# Patient Record
Sex: Female | Born: 1970 | Race: White | Hispanic: No | Smoking: Former smoker
Health system: Southern US, Community
[De-identification: ages and names within clinical notes are randomized; demographics above are authoritative.]

## PROBLEM LIST (undated history)

## (undated) DIAGNOSIS — E669 Obesity, unspecified: Secondary | ICD-10-CM

## (undated) DIAGNOSIS — C801 Malignant (primary) neoplasm, unspecified: Secondary | ICD-10-CM

## (undated) DIAGNOSIS — C189 Malignant neoplasm of colon, unspecified: Secondary | ICD-10-CM

## (undated) DIAGNOSIS — J189 Pneumonia, unspecified organism: Secondary | ICD-10-CM

## (undated) DIAGNOSIS — E119 Type 2 diabetes mellitus without complications: Secondary | ICD-10-CM

## (undated) DIAGNOSIS — I890 Lymphedema, not elsewhere classified: Secondary | ICD-10-CM

## (undated) HISTORY — PX: COLON SURGERY: SHX602

## (undated) HISTORY — PX: HERNIA REPAIR: SHX51

## (undated) HISTORY — PX: ABDOMINAL HYSTERECTOMY: SHX81

---

## 2007-11-18 ENCOUNTER — Emergency Department (HOSPITAL_COMMUNITY): Admission: EM | Admit: 2007-11-18 | Discharge: 2007-11-18 | Payer: Self-pay | Admitting: Emergency Medicine

## 2008-01-04 ENCOUNTER — Emergency Department (HOSPITAL_COMMUNITY): Admission: EM | Admit: 2008-01-04 | Discharge: 2008-01-04 | Payer: Self-pay | Admitting: Emergency Medicine

## 2009-12-19 ENCOUNTER — Emergency Department (HOSPITAL_COMMUNITY): Admission: EM | Admit: 2009-12-19 | Discharge: 2009-12-19 | Payer: Self-pay | Admitting: Emergency Medicine

## 2009-12-19 ENCOUNTER — Emergency Department (HOSPITAL_COMMUNITY): Admission: EM | Admit: 2009-12-19 | Discharge: 2009-12-19 | Payer: Self-pay | Admitting: Family Medicine

## 2010-03-18 ENCOUNTER — Emergency Department (HOSPITAL_COMMUNITY): Admission: EM | Admit: 2010-03-18 | Discharge: 2010-03-18 | Payer: Self-pay | Admitting: Emergency Medicine

## 2010-09-13 ENCOUNTER — Emergency Department (HOSPITAL_COMMUNITY)
Admission: EM | Admit: 2010-09-13 | Discharge: 2010-09-14 | Payer: Self-pay | Source: Home / Self Care | Admitting: Emergency Medicine

## 2010-11-26 LAB — URINALYSIS, ROUTINE W REFLEX MICROSCOPIC
Nitrite: POSITIVE — AB
Protein, ur: 30 mg/dL — AB
Specific Gravity, Urine: 1.027 (ref 1.005–1.030)
pH: 6 (ref 5.0–8.0)

## 2010-11-26 LAB — CBC
HCT: 37.3 % (ref 36.0–46.0)
Hemoglobin: 10.9 g/dL — ABNORMAL LOW (ref 12.0–15.0)
MCH: 21.7 pg — ABNORMAL LOW (ref 26.0–34.0)
MCV: 74.2 fL — ABNORMAL LOW (ref 78.0–100.0)

## 2010-11-26 LAB — DIFFERENTIAL
Basophils Relative: 0 % (ref 0–1)
Eosinophils Absolute: 0.1 10*3/uL (ref 0.0–0.7)
Lymphocytes Relative: 21 % (ref 12–46)
Monocytes Absolute: 1.5 10*3/uL — ABNORMAL HIGH (ref 0.1–1.0)
Monocytes Relative: 10 % (ref 3–12)

## 2010-11-26 LAB — POCT CARDIAC MARKERS
CKMB, poc: 1 ng/mL (ref 1.0–8.0)
Myoglobin, poc: 63.5 ng/mL (ref 12–200)
Troponin i, poc: <0.05 ng/mL (ref 0.00–0.09)

## 2010-11-26 LAB — D-DIMER, QUANTITATIVE: D-Dimer, Quant: 0.24 ug/mL-FEU (ref 0.00–0.48)

## 2010-11-26 LAB — BASIC METABOLIC PANEL
BUN: 11 mg/dL (ref 6–23)
CO2: 25 mEq/L (ref 19–32)
Creatinine, Ser: 0.79 mg/dL (ref 0.4–1.2)
GFR calc non Af Amer: >60 mL/min (ref >60)

## 2010-11-26 LAB — URINE MICROSCOPIC-ADD ON

## 2010-12-02 LAB — URINALYSIS, ROUTINE W REFLEX MICROSCOPIC
Glucose, UA: NEGATIVE mg/dL
Ketones, ur: NEGATIVE mg/dL
Nitrite: NEGATIVE
Urobilinogen, UA: 0.2 mg/dL (ref 0.0–1.0)
pH: 6.5 (ref 5.0–8.0)

## 2010-12-05 LAB — COMPREHENSIVE METABOLIC PANEL
BUN: 11 mg/dL (ref 6–23)
Calcium: 8.7 mg/dL (ref 8.4–10.5)
Creatinine, Ser: 0.75 mg/dL (ref 0.4–1.2)
Glucose, Bld: 92 mg/dL (ref 70–99)
Sodium: 138 mEq/L (ref 135–145)
Total Bilirubin: 0.5 mg/dL (ref 0.3–1.2)
Total Protein: 6.9 g/dL (ref 6.0–8.3)

## 2010-12-05 LAB — PROTIME-INR: Prothrombin Time: 13.1 seconds (ref 11.6–15.2)

## 2010-12-05 LAB — CBC
HCT: 34.7 % — ABNORMAL LOW (ref 36.0–46.0)
Hemoglobin: 11.3 g/dL — ABNORMAL LOW (ref 12.0–15.0)
Platelets: 366 10*3/uL (ref 150–400)

## 2010-12-05 LAB — DIFFERENTIAL
Basophils Absolute: 0.1 10*3/uL (ref 0.0–0.1)
Eosinophils Relative: 2 % (ref 0–5)
Lymphs Abs: 2.4 10*3/uL (ref 0.7–4.0)
Monocytes Absolute: 0.9 10*3/uL (ref 0.1–1.0)
Neutro Abs: 9.8 10*3/uL — ABNORMAL HIGH (ref 1.7–7.7)

## 2010-12-05 LAB — MAGNESIUM: Magnesium: 2.2 mg/dL (ref 1.5–2.5)

## 2011-12-16 ENCOUNTER — Encounter (HOSPITAL_COMMUNITY): Payer: Self-pay | Admitting: Emergency Medicine

## 2011-12-16 ENCOUNTER — Emergency Department (HOSPITAL_COMMUNITY)
Admission: EM | Admit: 2011-12-16 | Discharge: 2011-12-16 | Disposition: A | Discharge status: Home / Self Care | Payer: Self-pay | Attending: Emergency Medicine | Admitting: Emergency Medicine

## 2011-12-16 DIAGNOSIS — F17200 Nicotine dependence, unspecified, uncomplicated: Secondary | ICD-10-CM | POA: Insufficient documentation

## 2011-12-16 DIAGNOSIS — N939 Abnormal uterine and vaginal bleeding, unspecified: Secondary | ICD-10-CM

## 2011-12-16 DIAGNOSIS — N898 Other specified noninflammatory disorders of vagina: Secondary | ICD-10-CM | POA: Insufficient documentation

## 2011-12-16 LAB — WET PREP, GENITAL: Yeast Wet Prep HPF POC: NONE SEEN

## 2011-12-16 NOTE — Discharge Instructions (Signed)
[facility] health Department can be used as followup for yearly Pap smears. Followup with [facility] clinic as needed for further evaluation and management of irregular or heavy vaginal bleeding. Return to emergency department for emergent changing or worsening symptoms.

## 2011-12-16 NOTE — ED Provider Notes (Signed)
History     CSN: [id number]  Arrival date & time 12/16/11  1158   First MD Initiated Contact with Patient 12/16/11 1305      Chief Complaint  Patient presents with  . Vaginal Bleeding    (Consider location/radiation/quality/duration/timing/severity/associated sxs/prior treatment) HPI  Presents to emergency department complaining of acute onset vaginal bleeding. Patient states that for 7 years since having her tubes tied she's had very regular periods, without missing a period, with normal heavy consistency. Patient states she had a normal period in the month of January and February as usual however she did not have a period in March and therefore took a home pregnancy the test last week which she states was positive. Patient states that today, April 1, she had acute onset bleeding. Patient is concerned that she had a positive home pregnancy test a week ago and that the bleeding today has been very light. Patient states normally on her first day of her cycle she has heavy bleeding. Patient denies any associated abdominal pain. She denies dyspareunia, fevers, chills, chest pain, shortness of breath. Patient no known medical problems and takes no medicines on a regular basis. Patient has not seen an OB/GYN since her pregnancy 7 years ago. Patient states she's not seen OB/GYN for any other health care provider for general women's screening exams or Pap smears. Patient states that her mother did not start menopause until her mid 82s.  History reviewed. No pertinent past medical history.  History reviewed. No pertinent past surgical history.  No family history on file.  History  Substance Use Topics  . Smoking status: Current Everyday Smoker -- 0.5 packs/day    Types: Cigarettes  . Smokeless tobacco: Not on file  . Alcohol Use: No    OB History    Grav Para Term Preterm Abortions TAB SAB Ect Mult Living                  Review of Systems  All other systems reviewed and are  negative.    Allergies  Review of patient's allergies indicates no known allergies.  Home Medications  No current outpatient prescriptions on file.  BP 144/77  Pulse 82  Temp(Src) 98.4 F (36.9 C) (Oral)  Resp 18  SpO2 98%  LMP 11/07/2011  Physical Exam  Nursing note and vitals reviewed. Constitutional: She is oriented to person, place, and time. She appears well-developed and well-nourished. No distress.       Morbidly obese  HENT:  Head: Normocephalic and atraumatic.  Eyes: Conjunctivae are normal.  Neck: Normal range of motion. Neck supple.  Cardiovascular: Normal rate, regular rhythm, normal heart sounds and intact distal pulses.  Exam reveals no gallop and no friction rub.   No murmur heard. Pulmonary/Chest: Effort normal and breath sounds normal. No respiratory distress. She has no wheezes. She has no rales. She exhibits no tenderness.  Abdominal: Soft. Bowel sounds are normal. She exhibits no distension and no mass. There is no tenderness. There is no rebound and no guarding.  Genitourinary: Vagina normal and uterus normal.       Copious amount of blood in vaginal vault with clotting. No CMT TTP or adnexal TTP. No products of conception  Musculoskeletal: Normal range of motion. She exhibits no edema and no tenderness.  Neurological: She is alert and oriented to person, place, and time.  Skin: Skin is warm and dry. No rash noted. She is not diaphoretic. No erythema.  Psychiatric: She has a normal mood and  affect.    ED Course  Procedures (including critical care time)  Labs Reviewed  WET PREP, GENITAL - Abnormal; Notable for the following:    Clue Cells Wet Prep HPF POC FEW (*)    WBC, Wet Prep HPF POC MODERATE (*)    All other components within normal limits  POCT PREGNANCY, URINE  HCG, QUANTITATIVE, PREGNANCY  GC/CHLAMYDIA PROBE AMP, GENITAL   No results found.   1. Vaginal bleeding       MDM  Abdomen nontender with no complaint of pelvic pain with  vaginal bleeding. No CMT or adnexal TTP. Our urine pregnancy test and beta hCG are both negative for pregnancy with hCG less than 1. Bleeding  is likely an irregular cycle in patient with no pain or signs of pregnancy who is still menstruating.  Patient given resources for women's health followup for regular health care management.        Jenness Corner, [place] 12/16/11 1434

## 2011-12-16 NOTE — ED Notes (Signed)
Pt. Stated, I missed my period, and this morning I started bleeding

## 2011-12-16 NOTE — ED Provider Notes (Signed)
Medical screening examination/treatment/procedure(s) were conducted as a shared visit with non-physician practitioner(s) and myself.  I personally evaluated the patient during the encounter   Samon Dishner, MD 12/16/11 1527 

## 2011-12-16 NOTE — ED Notes (Signed)
Informed patient and/or family of status. No voiced complaints presently. NAD.  

## 2011-12-16 NOTE — ED Notes (Signed)
Pt. Stated , i had a tubal ligation 7 years ago.

## 2011-12-16 NOTE — ED Notes (Signed)
Reports missed last period which was due Mar 21. Had positive pregnancy test Sat. Has had a tubal ligation 7 yrs ago. C/o vaginal bleeding started this am but is not as heavy  As a normal period. Denies any pain.

## 2011-12-16 NOTE — ED Notes (Signed)
Pt. Stated, I had only my Metformin yesterday only

## 2011-12-17 LAB — GC/CHLAMYDIA PROBE AMP, GENITAL: GC Probe Amp, Genital: NEGATIVE

## 2012-04-12 ENCOUNTER — Emergency Department (HOSPITAL_COMMUNITY): Payer: Self-pay

## 2012-04-12 ENCOUNTER — Emergency Department (HOSPITAL_COMMUNITY)
Admission: EM | Admit: 2012-04-12 | Discharge: 2012-04-12 | Disposition: A | Discharge status: Home / Self Care | Payer: Self-pay | Attending: Emergency Medicine | Admitting: Emergency Medicine

## 2012-04-12 ENCOUNTER — Encounter (HOSPITAL_COMMUNITY): Payer: Self-pay | Admitting: *Deleted

## 2012-04-12 DIAGNOSIS — S63209A Unspecified subluxation of unspecified finger, initial encounter: Secondary | ICD-10-CM

## 2012-04-12 DIAGNOSIS — W19XXXA Unspecified fall, initial encounter: Secondary | ICD-10-CM

## 2012-04-12 DIAGNOSIS — S63279A Dislocation of unspecified interphalangeal joint of unspecified finger, initial encounter: Secondary | ICD-10-CM | POA: Insufficient documentation

## 2012-04-12 DIAGNOSIS — F17200 Nicotine dependence, unspecified, uncomplicated: Secondary | ICD-10-CM | POA: Insufficient documentation

## 2012-04-12 DIAGNOSIS — W010XXA Fall on same level from slipping, tripping and stumbling without subsequent striking against object, initial encounter: Secondary | ICD-10-CM | POA: Insufficient documentation

## 2012-04-12 DIAGNOSIS — E669 Obesity, unspecified: Secondary | ICD-10-CM | POA: Insufficient documentation

## 2012-04-12 HISTORY — DX: Obesity, unspecified: E66.9

## 2012-04-12 MED ORDER — HYDROMORPHONE HCL PF 2 MG/ML IJ SOLN
2.0000 mg | INTRAMUSCULAR | Status: AC
  Administered 2012-04-12: 2 mg via INTRAMUSCULAR
  Filled 2012-04-12: qty 1

## 2012-04-12 NOTE — ED Notes (Signed)
Pt reports slipping and falling this am, possible dislocation of right little finger.

## 2012-04-12 NOTE — ED Provider Notes (Signed)
History     CSN: [id number]  Arrival date & time 04/12/12  1117   First MD Initiated Contact with Patient 04/12/12 1149      Chief Complaint  Patient presents with  . Finger Injury    (Consider location/radiation/quality/duration/timing/severity/associated sxs/prior treatment) HPI Comments: Patient is a 41 year-old female who presents with pain in the 5th digit of her right hand which resulted from when she slipped on water and landed on her right hand this morning. The pain is constant and 10 out of 10 on the pain scale. She notes decreased ROM in the affected digit secondary to pain. She denies numbness and tingling. She denies any other injuries from the fall. She is accompanied by a family member who witnessed the fall and states that she did not hit her head or lose consciousness.  The history is provided by the patient.    Past Medical History  Diagnosis Date  . Obesity     History reviewed. No pertinent past surgical history.  History reviewed. No pertinent family history.  History  Substance Use Topics  . Smoking status: Current Everyday Smoker -- 0.5 packs/day    Types: Cigarettes  . Smokeless tobacco: Not on file  . Alcohol Use: No    OB History    Grav Para Term Preterm Abortions TAB SAB Ect Mult Living                  Review of Systems  Cardiovascular: Negative for chest pain.  Gastrointestinal: Negative for abdominal pain.  Musculoskeletal: Negative for back pain.  Skin: Negative for color change and pallor.  Neurological: Negative for syncope, weakness, numbness and headaches.    Allergies  Review of patient's allergies indicates no known allergies.  Home Medications  No current outpatient prescriptions on file.  BP 135/83  Pulse 113  Temp 98.3 F (36.8 C) (Oral)  Resp 22  SpO2 98%  Physical Exam  Nursing note and vitals reviewed. Constitutional: She appears well-developed and well-nourished. No distress.  HENT:  Head: Normocephalic  and atraumatic.  Neck: Neck supple.  Pulmonary/Chest: Effort normal.  Musculoskeletal: She exhibits no edema.       Right hand: She exhibits decreased range of motion, tenderness and deformity. She exhibits no bony tenderness, normal capillary refill, no laceration and no swelling. normal sensation noted. Normal strength noted.       Hands: Neurological: She is alert.  Skin: She is not diaphoretic.    ED Course  Reduction of dislocation Performed by: Trixie Dredge Authorized by: Trixie Dredge Consent: Verbal consent obtained. Risks and benefits: risks, benefits and alternatives were discussed Consent given by: patient Patient understanding: patient states understanding of the procedure being performed Patient identity confirmed: verbally with patient Local anesthesia used: yes Anesthesia: digital block Local anesthetic: lidocaine 2% without epinephrine Anesthetic total: 5 ml Patient sedated: no Patient tolerance: Patient tolerated the procedure well with no immediate complications.   (including critical care time)  Labs Reviewed - No data to display Dg Finger Little Right  04/12/2012  *RADIOLOGY REPORT*  Clinical Data: Reduction of dislocation.  RIGHT LITTLE FINGER 2+V  Comparison: Earlier film, same date.  Findings: Reduction of the PIP dislocation.  No fracture.  IMPRESSION: Reduction of PIP dislocation.  No definite fracture.  Original Report Authenticated By: P. Loralie Champagne, M.D.   Dg Finger Little Right  04/12/2012  *RADIOLOGY REPORT*  Clinical Data: Pain post fall  RIGHT LITTLE FINGER 2+V  Comparison: None.  Findings: Four views  of the right fifth finger submitted.  There is mild lateral subluxation proximal interphalangeal joint right fifth finger. No definite fracture is identified.  IMPRESSION: Mild subluxation of the proximal interphalangeal joint right fifth finger. No fracture is identified.  Original Report Authenticated By: Natasha Mead, M.D.     1. Subluxation Of  Finger   2. Fall       MDM  Obese patient with [facility] with subluxation of PIP, reduced under digital block with great relief.  Pt had full AROM without pain following reduction.  Reduction film shows great improvement.  Neurovascularly intact.  No other obvious injuries.  Discussed all results with patient.  Pt given return precautions.  Pt verbalizes understanding and agrees with plan.           [place], [place] 04/12/12 1626

## 2012-08-16 ENCOUNTER — Emergency Department (HOSPITAL_COMMUNITY)
Admission: EM | Admit: 2012-08-16 | Discharge: 2012-08-16 | Disposition: A | Discharge status: Home / Self Care | Payer: No Typology Code available for payment source | Attending: Emergency Medicine | Admitting: Emergency Medicine

## 2012-08-16 ENCOUNTER — Emergency Department (HOSPITAL_COMMUNITY): Payer: No Typology Code available for payment source

## 2012-08-16 ENCOUNTER — Encounter (HOSPITAL_COMMUNITY): Payer: Self-pay

## 2012-08-16 DIAGNOSIS — Y929 Unspecified place or not applicable: Secondary | ICD-10-CM | POA: Insufficient documentation

## 2012-08-16 DIAGNOSIS — T148XXA Other injury of unspecified body region, initial encounter: Secondary | ICD-10-CM | POA: Insufficient documentation

## 2012-08-16 DIAGNOSIS — S298XXA Other specified injuries of thorax, initial encounter: Secondary | ICD-10-CM | POA: Insufficient documentation

## 2012-08-16 DIAGNOSIS — T07XXXA Unspecified multiple injuries, initial encounter: Secondary | ICD-10-CM | POA: Insufficient documentation

## 2012-08-16 DIAGNOSIS — F17200 Nicotine dependence, unspecified, uncomplicated: Secondary | ICD-10-CM | POA: Insufficient documentation

## 2012-08-16 DIAGNOSIS — Y939 Activity, unspecified: Secondary | ICD-10-CM | POA: Insufficient documentation

## 2012-08-16 DIAGNOSIS — E669 Obesity, unspecified: Secondary | ICD-10-CM | POA: Insufficient documentation

## 2012-08-16 MED ORDER — HYDROCODONE-ACETAMINOPHEN 5-325 MG PO TABS: 1.0000 | ORAL_TABLET | Freq: Four times a day (QID) | ORAL | Status: DC | PRN

## 2012-08-16 MED ORDER — NAPROXEN 500 MG PO TABS: 500.0000 mg | ORAL_TABLET | Freq: Two times a day (BID) | ORAL | Status: DC
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
## 2012-08-19 ENCOUNTER — Emergency Department (HOSPITAL_COMMUNITY)
Admission: EM | Admit: 2012-08-19 | Discharge: 2012-08-19 | Disposition: A | Discharge status: Home / Self Care | Payer: No Typology Code available for payment source | Attending: Emergency Medicine | Admitting: Emergency Medicine

## 2012-08-19 ENCOUNTER — Emergency Department (HOSPITAL_COMMUNITY): Payer: No Typology Code available for payment source

## 2012-08-19 ENCOUNTER — Encounter (HOSPITAL_COMMUNITY): Payer: Self-pay | Admitting: *Deleted

## 2012-08-19 DIAGNOSIS — S299XXA Unspecified injury of thorax, initial encounter: Secondary | ICD-10-CM

## 2012-08-19 DIAGNOSIS — Y9389 Activity, other specified: Secondary | ICD-10-CM | POA: Insufficient documentation

## 2012-08-19 DIAGNOSIS — S298XXA Other specified injuries of thorax, initial encounter: Secondary | ICD-10-CM | POA: Insufficient documentation

## 2012-08-19 DIAGNOSIS — E669 Obesity, unspecified: Secondary | ICD-10-CM | POA: Insufficient documentation

## 2012-08-19 DIAGNOSIS — R0602 Shortness of breath: Secondary | ICD-10-CM | POA: Insufficient documentation

## 2012-08-19 DIAGNOSIS — F17200 Nicotine dependence, unspecified, uncomplicated: Secondary | ICD-10-CM | POA: Insufficient documentation

## 2012-08-19 MED ORDER — OXYCODONE-ACETAMINOPHEN 5-325 MG PO TABS
2.0000 | ORAL_TABLET | Freq: Once | ORAL | Status: AC
  Administered 2012-08-19: 2 via ORAL
  Filled 2012-08-19: qty 2
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
## 2012-08-26 ENCOUNTER — Encounter (HOSPITAL_COMMUNITY): Payer: Self-pay | Admitting: *Deleted

## 2012-08-26 ENCOUNTER — Emergency Department (HOSPITAL_COMMUNITY): Payer: No Typology Code available for payment source

## 2012-08-26 ENCOUNTER — Emergency Department (HOSPITAL_COMMUNITY)
Admission: EM | Admit: 2012-08-26 | Discharge: 2012-08-26 | Disposition: A | Discharge status: Home / Self Care | Payer: No Typology Code available for payment source | Attending: Emergency Medicine | Admitting: Emergency Medicine

## 2012-08-26 DIAGNOSIS — F17200 Nicotine dependence, unspecified, uncomplicated: Secondary | ICD-10-CM | POA: Insufficient documentation

## 2012-08-26 DIAGNOSIS — S298XXA Other specified injuries of thorax, initial encounter: Secondary | ICD-10-CM | POA: Insufficient documentation

## 2012-08-26 DIAGNOSIS — M549 Dorsalgia, unspecified: Secondary | ICD-10-CM | POA: Insufficient documentation

## 2012-08-26 DIAGNOSIS — R0781 Pleurodynia: Secondary | ICD-10-CM

## 2012-08-26 DIAGNOSIS — Y9389 Activity, other specified: Secondary | ICD-10-CM | POA: Insufficient documentation

## 2012-08-26 DIAGNOSIS — Y92410 Unspecified street and highway as the place of occurrence of the external cause: Secondary | ICD-10-CM | POA: Insufficient documentation

## 2012-08-26 DIAGNOSIS — E669 Obesity, unspecified: Secondary | ICD-10-CM | POA: Insufficient documentation

## 2012-08-26 MED ORDER — NAPROXEN 500 MG PO TABS: 500.0000 mg | ORAL_TABLET | Freq: Two times a day (BID) | ORAL | Status: DC

## 2012-08-26 MED ORDER — NAPROXEN 250 MG PO TABS
500.0000 mg | ORAL_TABLET | Freq: Once | ORAL | Status: AC
  Administered 2012-08-26: 500 mg via ORAL
  Filled 2012-08-26: qty 2

## 2012-08-26 MED ORDER — HYDROCODONE-ACETAMINOPHEN 5-325 MG PO TABS
2.0000 | ORAL_TABLET | Freq: Once | ORAL | Status: AC
  Administered 2012-08-26: 2 via ORAL
  Filled 2012-08-26: qty 2

## 2012-08-26 MED ORDER — HYDROCODONE-ACETAMINOPHEN 5-325 MG PO TABS: 2.0000 | ORAL_TABLET | ORAL | Status: DC | PRN
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
## 2014-05-08 ENCOUNTER — Emergency Department (HOSPITAL_COMMUNITY)
Admission: EM | Admit: 2014-05-08 | Discharge: 2014-05-08 | Disposition: A | Discharge status: Home / Self Care | Payer: Self-pay | Attending: Emergency Medicine | Admitting: Emergency Medicine

## 2014-05-08 ENCOUNTER — Emergency Department (HOSPITAL_COMMUNITY): Payer: Self-pay

## 2014-05-08 ENCOUNTER — Encounter (HOSPITAL_COMMUNITY): Payer: Self-pay | Admitting: Emergency Medicine

## 2014-05-08 DIAGNOSIS — K429 Umbilical hernia without obstruction or gangrene: Secondary | ICD-10-CM | POA: Insufficient documentation

## 2014-05-08 DIAGNOSIS — R1011 Right upper quadrant pain: Secondary | ICD-10-CM | POA: Insufficient documentation

## 2014-05-08 DIAGNOSIS — R112 Nausea with vomiting, unspecified: Secondary | ICD-10-CM | POA: Insufficient documentation

## 2014-05-08 DIAGNOSIS — Z3202 Encounter for pregnancy test, result negative: Secondary | ICD-10-CM | POA: Insufficient documentation

## 2014-05-08 DIAGNOSIS — F17200 Nicotine dependence, unspecified, uncomplicated: Secondary | ICD-10-CM | POA: Insufficient documentation

## 2014-05-08 DIAGNOSIS — E669 Obesity, unspecified: Secondary | ICD-10-CM | POA: Insufficient documentation

## 2014-05-08 DIAGNOSIS — K8020 Calculus of gallbladder without cholecystitis without obstruction: Secondary | ICD-10-CM

## 2014-05-08 LAB — COMPREHENSIVE METABOLIC PANEL
ALT: 12 U/L (ref 0–35)
ANION GAP: 17 — AB (ref 5–15)
AST: 15 U/L (ref 0–37)
Albumin: 3.5 g/dL (ref 3.5–5.2)
Alkaline Phosphatase: 80 U/L (ref 39–117)
BUN: 9 mg/dL (ref 6–23)
CALCIUM: 9.2 mg/dL (ref 8.4–10.5)
CO2: 22 meq/L (ref 19–32)
Chloride: 103 mEq/L (ref 96–112)
Creatinine, Ser: 0.75 mg/dL (ref 0.50–1.10)
GLUCOSE: 108 mg/dL — AB (ref 70–99)
Potassium: 3.1 mEq/L — ABNORMAL LOW (ref 3.7–5.3)
Sodium: 142 mEq/L (ref 137–147)
TOTAL PROTEIN: 7.2 g/dL (ref 6.0–8.3)
Total Bilirubin: 0.5 mg/dL (ref 0.3–1.2)

## 2014-05-08 LAB — URINALYSIS, ROUTINE W REFLEX MICROSCOPIC
Bilirubin Urine: NEGATIVE
Glucose, UA: NEGATIVE mg/dL
KETONES UR: NEGATIVE mg/dL
Nitrite: NEGATIVE
PROTEIN: NEGATIVE mg/dL
Specific Gravity, Urine: 1.003 — ABNORMAL LOW (ref 1.005–1.030)
UROBILINOGEN UA: 0.2 mg/dL (ref 0.0–1.0)
pH: 7 (ref 5.0–8.0)

## 2014-05-08 LAB — CBC WITH DIFFERENTIAL/PLATELET
BASOS ABS: 0 10*3/uL (ref 0.0–0.1)
Basophils Relative: 0 % (ref 0–1)
EOS ABS: 0.2 10*3/uL (ref 0.0–0.7)
Eosinophils Relative: 1 % (ref 0–5)
HEMATOCRIT: 34.8 % — AB (ref 36.0–46.0)
Hemoglobin: 10.1 g/dL — ABNORMAL LOW (ref 12.0–15.0)
LYMPHS ABS: 1.1 10*3/uL (ref 0.7–4.0)
LYMPHS PCT: 7 % — AB (ref 12–46)
MCH: 20.4 pg — AB (ref 26.0–34.0)
MCHC: 29 g/dL — AB (ref 30.0–36.0)
MCV: 70.4 fL — AB (ref 78.0–100.0)
MONO ABS: 0.9 10*3/uL (ref 0.1–1.0)
Monocytes Relative: 6 % (ref 3–12)
NEUTROS ABS: 13.2 10*3/uL — AB (ref 1.7–7.7)
Neutrophils Relative %: 86 % — ABNORMAL HIGH (ref 43–77)
Platelets: 402 10*3/uL — ABNORMAL HIGH (ref 150–400)
RBC: 4.94 MIL/uL (ref 3.87–5.11)
RDW: 20.3 % — AB (ref 11.5–15.5)
WBC: 15.4 10*3/uL — ABNORMAL HIGH (ref 4.0–10.5)

## 2014-05-08 LAB — URINE MICROSCOPIC-ADD ON

## 2014-05-08 LAB — LIPASE, BLOOD: Lipase: 27 U/L (ref 11–59)

## 2014-05-08 MED ORDER — OXYCODONE-ACETAMINOPHEN 5-325 MG PO TABS: 1.0000 | ORAL_TABLET | ORAL | Status: DC | PRN

## 2014-05-08 MED ORDER — HYDROMORPHONE HCL PF 1 MG/ML IJ SOLN
1.0000 mg | Freq: Once | INTRAMUSCULAR | Status: AC
  Administered 2014-05-08: 1 mg via INTRAVENOUS
  Filled 2014-05-08: qty 1

## 2014-05-08 MED ORDER — IOHEXOL 300 MG/ML  SOLN
50.0000 mL | Freq: Once | INTRAMUSCULAR | Status: AC | PRN
  Administered 2014-05-08: 50 mL via ORAL

## 2014-05-08 MED ORDER — ONDANSETRON HCL 4 MG PO TABS: 4.0000 mg | ORAL_TABLET | Freq: Four times a day (QID) | ORAL | Status: DC

## 2014-05-08 MED ORDER — IOHEXOL 300 MG/ML  SOLN
100.0000 mL | Freq: Once | INTRAMUSCULAR | Status: AC | PRN
  Administered 2014-05-08: 100 mL via INTRAVENOUS

## 2014-05-08 MED ORDER — SODIUM CHLORIDE 0.9 % IV BOLUS (SEPSIS)
1000.0000 mL | Freq: Once | INTRAVENOUS | Status: AC
  Administered 2014-05-08: 1000 mL via INTRAVENOUS

## 2014-05-08 NOTE — ED Notes (Signed)
Pt aware of need for urine specimen. 

## 2014-05-08 NOTE — Discharge Instructions (Signed)
Take the prescribed medication as directed. Try to limit fatty and greasy foods (fried, high fat/sugar content) Follow-up with [facility] [place] [facility]. Return to the ED for new or worsening symptoms.
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
## 2014-05-08 NOTE — ED Provider Notes (Signed)
CSN: [id number]     Arrival date & time 05/08/14  1312 History   First MD Initiated Contact with Patient 05/08/14 1458     Chief Complaint  Patient presents with  . Abdominal Pain     (Consider location/radiation/quality/duration/timing/severity/associated sxs/prior Treatment) The history is provided by the patient and medical records.   This is a 43 y.o. F with no significant PMH presenting to the ED for abdominal pain.  Patient states symptoms started approx 1 hour ago, described as an intense abdominal cramp that "feels like someone is twisting my intestines".  Endorses associated nausea, vomiting, and chills.  BMs have been regular, no diarrhea.  Freely passing flatus.  No recent travel, sick contacts, medication changes, abx use. Prior abdominal surgeries include c-section.  Denies urinary sx, vaginal discharge, or flank pain.  No prior hx kidney stones.  VS stable on arrival.  Past Medical History  Diagnosis Date  . Obesity    Past Surgical History  Procedure Laterality Date  . Cesarean section     No family history on file. History  Substance Use Topics  . Smoking status: Current Every Day Smoker -- 0.50 packs/day    Types: Cigarettes  . Smokeless tobacco: Not on file  . Alcohol Use: No   OB History   Grav Para Term Preterm Abortions TAB SAB Ect Mult Living                 Review of Systems  Gastrointestinal: Positive for nausea, vomiting and abdominal pain.  All other systems reviewed and are negative.     Allergies  Diphenhydramine; Codeine; and Morphine and related  Home Medications   Prior to Admission medications   Not on File   BP 143/53  Pulse 48  Temp(Src) 98.4 F (36.9 C) (Oral)  Resp 20  SpO2 100%  Physical Exam  Nursing note and vitals reviewed. Constitutional: She is oriented to person, place, and time. She appears well-developed and well-nourished. No distress.  HENT:  Head: Normocephalic and atraumatic.  Mouth/Throat: Oropharynx is  clear and moist.  Eyes: Conjunctivae and EOM are normal. Pupils are equal, round, and reactive to light.  Neck: Normal range of motion. Neck supple.  Cardiovascular: Normal rate, regular rhythm and normal heart sounds.   Pulmonary/Chest: Effort normal and breath sounds normal.  Abdominal: Soft. Bowel sounds are normal. There is tenderness. There is guarding. There is no CVA tenderness.  Abdomen soft, non-distended, tenderness localized to RUQ, epigastrium, and periumbilical regions with voluntary guarding No CVA tenderness  Musculoskeletal: Normal range of motion. She exhibits no edema.  Neurological: She is alert and oriented to person, place, and time.  Skin: Skin is warm and dry. She is not diaphoretic.  Psychiatric: She has a normal mood and affect.    ED Course  Procedures (including critical care time) Labs Review Labs Reviewed  CBC WITH DIFFERENTIAL - Abnormal; Notable for the following:    WBC 15.4 (*)    Hemoglobin 10.1 (*)    HCT 34.8 (*)    MCV 70.4 (*)    MCH 20.4 (*)    MCHC 29.0 (*)    RDW 20.3 (*)    Platelets 402 (*)    Neutrophils Relative % 86 (*)    Lymphocytes Relative 7 (*)    Neutro Abs 13.2 (*)    All other components within normal limits  COMPREHENSIVE METABOLIC PANEL - Abnormal; Notable for the following:    Potassium 3.1 (*)    Glucose, Bld  108 (*)    Anion gap 17 (*)    All other components within normal limits  URINALYSIS, ROUTINE W REFLEX MICROSCOPIC - Abnormal; Notable for the following:    APPearance CLOUDY (*)    Specific Gravity, Urine 1.003 (*)    Hgb urine dipstick MODERATE (*)    Leukocytes, UA LARGE (*)    All other components within normal limits  LIPASE, BLOOD  URINE MICROSCOPIC-ADD ON  POC URINE PREG, ED    Imaging Review Ct Abdomen Pelvis W Contrast  05/08/2014   CLINICAL DATA:  Abdominal pain, nausea and vomiting.  EXAM: CT ABDOMEN AND PELVIS WITH CONTRAST  TECHNIQUE: Multidetector CT imaging of the abdomen and pelvis was  performed using the standard protocol following bolus administration of intravenous contrast.  CONTRAST:  39mL OMNIPAQUE IOHEXOL 300 MG/ML SOLN, 158mL OMNIPAQUE IOHEXOL 300 MG/ML SOLN  COMPARISON:  None.  FINDINGS: Umbilical hernia containing fat. Two low density left adrenal masses. The more inferior mass measures 13 Hounsfield units with some peripheral soft tissue components and measures 2.8 x 2.1 cm on image number 29. The more superior mass measures 6 Hounsfield units in density and 3.2 x 3.2 cm on image number 26.  Minimal diffuse low density of the liver relative to the spleen. 5 mm gallstone in the gallbladder. No gallbladder wall thickening or pericholecystic fluid. Unremarkable spleen, pancreas, right adrenal gland, kidney, urinary bladder, uterus and ovaries. Minimal free peritoneal fluid in the pelvic cul-de-sac.  No gastrointestinal abnormalities or enlarged lymph nodes. Clear lung bases. Lumbar and lower thoracic spine degenerative changes.  IMPRESSION: 1. No acute abnormality. 2. Umbilical hernia containing fat. 3. Probable left adrenal adenomas or old areas of hemorrhage. 4. Cholelithiasis without evidence of cholecystitis. 5. Minimal diffuse hepatic steatosis.   Electronically Signed   By: Enrique Sack M.D.   On: 05/08/2014 17:26     EKG Interpretation None      MDM   Final diagnoses:  Gallstones   43 year old female with abdominal pain, nausea, and vomiting onset this afternoon. On exam, patient does appear uncomfortable but is afebrile and non-toxic. She has tenderness of her RUQ, epigastrium, and periumbilical regions with voluntary guarding.  Labs with leukocytosis of 15.4.  U/a with moderate blood (patient just finished her menstrual yesterday).  No flank pain or urinary sx to suggest kidney stones.  Will obtain CT abdomen pelvis for further evaluation.  IVFB, zofran, and dilaudid given for pain control.  CT scan revealing gallstones and fat containing umbilical hernia.  Pain and  nausea well controlled.  Patient appears much more comfortable and has tolerated PO without difficulty.  She will be discharged home with symptomatic control.  Recommended FU with general surgery, discussed limiting fatty/greasy foods to prevent attacks.  Discussed plan with patient, he/she acknowledged understanding and agreed with plan of care.  Return precautions given for new or worsening symptoms.  Larene Pickett, PA-C 05/08/14 2200

## 2014-05-08 NOTE — ED Notes (Addendum)
Per EMS-pt c/o abdominal pain that started 1 hour ago. Nausea, vomiting. No history. 4mg  Zofran given.

## 2014-05-09 LAB — POC URINE PREG, ED: PREG TEST UR: NEGATIVE

## 2014-05-11 NOTE — ED Provider Notes (Signed)
Medical screening examination/treatment/procedure(s) were performed by non-physician practitioner and as supervising physician I was immediately available for consultation/collaboration.   EKG Interpretation None        Wandra Arthurs, MD 05/11/14 [phone]

## 2014-05-13 ENCOUNTER — Encounter (HOSPITAL_COMMUNITY): Payer: Self-pay | Admitting: Emergency Medicine

## 2014-05-13 ENCOUNTER — Emergency Department (HOSPITAL_COMMUNITY): Payer: Self-pay

## 2014-05-13 ENCOUNTER — Emergency Department (HOSPITAL_COMMUNITY)
Admission: EM | Admit: 2014-05-13 | Discharge: 2014-05-13 | Disposition: A | Discharge status: Home / Self Care | Payer: Self-pay | Attending: Emergency Medicine | Admitting: Emergency Medicine

## 2014-05-13 DIAGNOSIS — Z3202 Encounter for pregnancy test, result negative: Secondary | ICD-10-CM | POA: Insufficient documentation

## 2014-05-13 DIAGNOSIS — Z79899 Other long term (current) drug therapy: Secondary | ICD-10-CM | POA: Insufficient documentation

## 2014-05-13 DIAGNOSIS — Z91040 Latex allergy status: Secondary | ICD-10-CM | POA: Insufficient documentation

## 2014-05-13 DIAGNOSIS — E669 Obesity, unspecified: Secondary | ICD-10-CM | POA: Insufficient documentation

## 2014-05-13 DIAGNOSIS — N730 Acute parametritis and pelvic cellulitis: Secondary | ICD-10-CM | POA: Insufficient documentation

## 2014-05-13 DIAGNOSIS — F17200 Nicotine dependence, unspecified, uncomplicated: Secondary | ICD-10-CM | POA: Insufficient documentation

## 2014-05-13 DIAGNOSIS — N898 Other specified noninflammatory disorders of vagina: Secondary | ICD-10-CM | POA: Insufficient documentation

## 2014-05-13 DIAGNOSIS — Z202 Contact with and (suspected) exposure to infections with a predominantly sexual mode of transmission: Secondary | ICD-10-CM | POA: Insufficient documentation

## 2014-05-13 DIAGNOSIS — N390 Urinary tract infection, site not specified: Secondary | ICD-10-CM | POA: Insufficient documentation

## 2014-05-13 LAB — CBC
HCT: 31.9 % — ABNORMAL LOW (ref 36.0–46.0)
HEMOGLOBIN: 9.5 g/dL — AB (ref 12.0–15.0)
MCH: 20.5 pg — AB (ref 26.0–34.0)
MCHC: 29.8 g/dL — AB (ref 30.0–36.0)
MCV: 68.8 fL — ABNORMAL LOW (ref 78.0–100.0)
PLATELETS: 479 10*3/uL — AB (ref 150–400)
RBC: 4.64 MIL/uL (ref 3.87–5.11)
RDW: 20.2 % — ABNORMAL HIGH (ref 11.5–15.5)
WBC: 11.3 10*3/uL — ABNORMAL HIGH (ref 4.0–10.5)

## 2014-05-13 LAB — URINALYSIS, ROUTINE W REFLEX MICROSCOPIC
Glucose, UA: NEGATIVE mg/dL
Hgb urine dipstick: NEGATIVE
KETONES UR: 15 mg/dL — AB
Nitrite: POSITIVE — AB
PROTEIN: 100 mg/dL — AB
Specific Gravity, Urine: 1.024 (ref 1.005–1.030)
Urobilinogen, UA: 1 mg/dL (ref 0.0–1.0)
pH: 6 (ref 5.0–8.0)

## 2014-05-13 LAB — COMPREHENSIVE METABOLIC PANEL
ALT: 14 U/L (ref 0–35)
AST: 14 U/L (ref 0–37)
Albumin: 2.9 g/dL — ABNORMAL LOW (ref 3.5–5.2)
Alkaline Phosphatase: 75 U/L (ref 39–117)
Anion gap: 14 (ref 5–15)
BUN: 8 mg/dL (ref 6–23)
CO2: 23 mEq/L (ref 19–32)
Calcium: 8.8 mg/dL (ref 8.4–10.5)
Chloride: 103 mEq/L (ref 96–112)
Creatinine, Ser: 0.84 mg/dL (ref 0.50–1.10)
GFR calc non Af Amer: 83 mL/min — ABNORMAL LOW (ref >90)
Glucose, Bld: 84 mg/dL (ref 70–99)
POTASSIUM: 3.4 meq/L — AB (ref 3.7–5.3)
SODIUM: 140 meq/L (ref 137–147)
TOTAL PROTEIN: 7.1 g/dL (ref 6.0–8.3)
Total Bilirubin: 0.4 mg/dL (ref 0.3–1.2)

## 2014-05-13 LAB — POC URINE PREG, ED: Preg Test, Ur: NEGATIVE

## 2014-05-13 LAB — URINE MICROSCOPIC-ADD ON

## 2014-05-13 LAB — WET PREP, GENITAL
CLUE CELLS WET PREP: NONE SEEN
TRICH WET PREP: NONE SEEN
Yeast Wet Prep HPF POC: NONE SEEN

## 2014-05-13 LAB — LIPASE, BLOOD: Lipase: 20 U/L (ref 11–59)

## 2014-05-13 MED ORDER — CEFTRIAXONE SODIUM 250 MG IJ SOLR
250.0000 mg | Freq: Once | INTRAMUSCULAR | Status: AC
  Administered 2014-05-13: 250 mg via INTRAMUSCULAR
  Filled 2014-05-13: qty 250

## 2014-05-13 MED ORDER — LIDOCAINE HCL (PF) 1 % IJ SOLN
INTRAMUSCULAR | Status: AC
  Filled 2014-05-13: qty 5

## 2014-05-13 MED ORDER — DOXYCYCLINE HYCLATE 100 MG PO CAPS: ORAL_CAPSULE | ORAL | Status: DC

## 2014-05-13 MED ORDER — CEPHALEXIN 500 MG PO CAPS: 500.0000 mg | ORAL_CAPSULE | Freq: Two times a day (BID) | ORAL | Status: DC

## 2014-05-13 MED ORDER — AZITHROMYCIN 250 MG PO TABS
1000.0000 mg | ORAL_TABLET | Freq: Once | ORAL | Status: AC
  Administered 2014-05-13: 1000 mg via ORAL
  Filled 2014-05-13: qty 4

## 2014-05-13 MED ORDER — POTASSIUM CHLORIDE CRYS ER 20 MEQ PO TBCR
40.0000 meq | EXTENDED_RELEASE_TABLET | Freq: Once | ORAL | Status: AC
  Administered 2014-05-13: 40 meq via ORAL
  Filled 2014-05-13: qty 2

## 2014-05-13 MED ORDER — LIDOCAINE HCL (PF) 1 % IJ SOLN
5.0000 mL | Freq: Once | INTRAMUSCULAR | Status: AC
  Administered 2014-05-13: 5 mL

## 2014-05-13 NOTE — ED Notes (Signed)
Marissa,PA at the bedside.  

## 2014-05-13 NOTE — Discharge Instructions (Signed)
Please call your doctor for a followup appointment within 24-48 hours. When you talk to your doctor please let them know that you were seen in the emergency department and have them acquire all of your records so that they can discuss the findings with you and formulate a treatment plan to fully care for your new and ongoing problems. Please call and set-up an appointment with [facility] Please rest and stay hydrated  Please take medications for UTI - Keflex and PID - Doxycycline Please avoid any sexual activity until re-checked and examined by [facility] hospital Please continue to take at home medications as prescribed Please continue to monitor symptoms closely and if symptoms are to worsen or change (fever greater than 101, chills, sweating, nausea, vomiting, chest pain, shortness of breathe, difficulty breathing, weakness, numbness, tingling, worsening or changes to pain pattern, worsening or changes to abdominal pain, right upper quadrant pain, inability to keep any food or fluids down, changes to vaginal discharge, vaginal bleeding, blood in the stools, black tarry stools) please report back to the Emergency Department immediately.   Pelvic Inflammatory Disease Pelvic inflammatory disease (PID) refers to an infection in some or all of the female organs. The infection can be in the uterus, ovaries, fallopian tubes, or the surrounding tissues in the pelvis. PID can cause abdominal or pelvic pain that comes on suddenly (acute pelvic pain). PID is a serious infection because it can lead to lasting (chronic) pelvic pain or the inability to have children (infertile).  CAUSES  The infection is often caused by the normal bacteria found in the vaginal tissues. PID may also be caused by an infection that is spread during sexual contact. PID can also occur following:   The birth of a baby.   A miscarriage.   An abortion.   Major pelvic surgery.   The use of an intrauterine device  (IUD).   A sexual assault.  RISK FACTORS Certain factors can put a person at higher risk for PID, such as:  Being younger than 25 years.  Being sexually active at [place] age.  Usingnonbarrier contraception.  Havingmultiple sexual partners.  Having sex with someone who has symptoms of a genital infection.  Using oral contraception. Other times, certain behaviors can increase the possibility of getting PID, such as:  Having sex during your period.  Using a vaginal douche.  Having an intrauterine device (IUD) in place. SYMPTOMS   Abdominal or pelvic pain.   Fever.   Chills.   Abnormal vaginal discharge.  Abnormal uterine bleeding.   Unusual pain shortly after finishing your period. DIAGNOSIS  Your caregiver will choose some of the following methods to make a diagnosis, such as:   Performinga physical exam and history. A pelvic exam typically reveals a very tender uterus and surrounding pelvis.   Ordering laboratory tests including a pregnancy test, blood tests, and urine test.  Orderingcultures of the vagina and cervix to check for a sexually transmitted infection (STI).  Performing an ultrasound.   Performing a laparoscopic procedure to look inside the pelvis.  TREATMENT   Antibiotic medicines may be prescribed and taken by mouth.   Sexual partners may be treated when the infection is caused by a sexually transmitted disease (STD).   Hospitalization may be needed to give antibiotics intravenously.  Surgery may be needed, but this is rare. It may take weeks until you are completely well. If you are diagnosed with PID, you should also be checked for human immunodeficiency virus (HIV). HOME  CARE INSTRUCTIONS   If given, take your antibiotics as directed. Finish the medicine even if you start to feel better.   Only take over-the-counter or prescription medicines for pain, discomfort, or fever as directed by your caregiver.   Do not have  sexual intercourse until treatment is completed or as directed by your caregiver. If PID is confirmed, your recent sexual partner(s) will need treatment.   Keep your follow-up appointments. SEEK MEDICAL CARE IF:   You have increased or abnormal vaginal discharge.   You need prescription medicine for your pain.   You vomit.   You cannot take your medicines.   Your partner has an STD.  SEEK IMMEDIATE MEDICAL CARE IF:   You have a fever.   You have increased abdominal or pelvic pain.   You have chills.   You have pain when you urinate.   You are not better after 72 hours following treatment.  MAKE SURE YOU:   Understand these instructions.  Will watch your condition.  Will get help right away if you are not doing well or get worse. Document Released: 09/02/2005 Document Revised: 12/28/2012 Document Reviewed: 08/29/2011 [facility] Patient Information 2015 [place], [place]. This information is not intended to replace advice given to you by your health care provider. Make sure you discuss any questions you have with your health care provider.  Urinary Tract Infection Urinary tract infections (UTIs) can develop anywhere along your urinary tract. Your urinary tract is your body's drainage system for removing wastes and extra water. Your urinary tract includes two kidneys, two ureters, a bladder, and a urethra. Your kidneys are a pair of bean-shaped organs. Each kidney is about the size of your fist. They are located below your ribs, one on each side of your spine. CAUSES Infections are caused by microbes, which are microscopic organisms, including fungi, viruses, and bacteria. These organisms are so small that they can only be seen through a microscope. Bacteria are the microbes that most commonly cause UTIs. SYMPTOMS  Symptoms of UTIs may vary by age and gender of the patient and by the location of the infection. Symptoms in young women typically include a frequent and  intense urge to urinate and a painful, burning feeling in the bladder or urethra during urination. Older women and men are more likely to be tired, shaky, and weak and have muscle aches and abdominal pain. A fever may mean the infection is in your kidneys. Other symptoms of a kidney infection include pain in your back or sides below the ribs, nausea, and vomiting. DIAGNOSIS To diagnose a UTI, your caregiver will ask you about your symptoms. Your caregiver also will ask to provide a urine sample. The urine sample will be tested for bacteria and white blood cells. White blood cells are made by your body to help fight infection. TREATMENT  Typically, UTIs can be treated with medication. Because most UTIs are caused by a bacterial infection, they usually can be treated with the use of antibiotics. The choice of antibiotic and length of treatment depend on your symptoms and the type of bacteria causing your infection. HOME CARE INSTRUCTIONS  If you were prescribed antibiotics, take them exactly as your caregiver instructs you. Finish the medication even if you feel better after you have only taken some of the medication.  Drink enough water and fluids to keep your urine clear or pale yellow.  Avoid caffeine, tea, and carbonated beverages. They tend to irritate your bladder.  Empty your bladder often. Avoid  holding urine for long periods of time.  Empty your bladder before and after sexual intercourse.  After a bowel movement, women should cleanse from front to back. Use each tissue only once. SEEK MEDICAL CARE IF:   You have back pain.  You develop a fever.  Your symptoms do not begin to resolve within 3 days. SEEK IMMEDIATE MEDICAL CARE IF:   You have severe back pain or lower abdominal pain.  You develop chills.  You have nausea or vomiting.  You have continued burning or discomfort with urination. MAKE SURE YOU:   Understand these instructions.  Will watch your condition.  Will  get help right away if you are not doing well or get worse. Document Released: 06/12/2005 Document Revised: 03/03/2012 Document Reviewed: 10/11/2011 [facility] Patient Information 2015 [place], [place]. This information is not intended to replace advice given to you by your health care provider. Make sure you discuss any questions you have with your health care provider.   Emergency Department Resource Guide 1) Find a Doctor and Pay Out of Pocket Although you won't have to find out who is covered by your insurance plan, it is a good idea to ask around and get recommendations. You will then need to call the office and see if the doctor you have chosen will accept you as a new patient and what types of options they offer for patients who are self-pay. Some doctors offer discounts or will set up payment plans for their patients who do not have insurance, but you will need to ask so you aren't surprised when you get to your appointment.  2) Contact Your Local Health Department Not all health departments have doctors that can see patients for sick visits, but many do, so it is worth a call to see if yours does. If you don't know where your local health department is, you can check in your phone book. The CDC also has a tool to help you locate your state's health department, and many state websites also have listings of all of their local health departments.  3) Find a [facility] If your illness is not likely to be very severe or complicated, you may want to try a walk in clinic. These are popping up all over the country in pharmacies, drugstores, and shopping centers. They're usually staffed by nurse practitioners or physician assistants that have been trained to treat common illnesses and complaints. They're usually fairly quick and inexpensive. However, if you have serious medical issues or chronic medical problems, these are probably not your best option.  No Primary Care Doctor: - Call Health Connect  at  [phone] - they can help you locate a primary care doctor that  accepts your insurance, provides certain services, etc. - Physician Referral Service- [phone]  Chronic Pain Problems: Organization         Address  Phone   Notes  [facility]  [phone] Patients need to be referred by their primary care doctor.   Medication Assistance: Organization         Address  Phone   Notes  [facility] Medication [address] [place], [place] [address] [phone] --Must be a resident of [facility] -- Must have NO insurance coverage whatsoever (no Medicaid/ Medicare, etc.) -- The pt. MUST have a primary care doctor that directs their care regularly and follows them in the community   MedAssist  [phone]   [employer]  ([phone])  [phone]    Agencies that provide inexpensive medical care: Organization         Address  Phone   Notes  [place]  [phone]   [facility]    [phone]   [facility] [place], [place] [address] [phone]   [place] [address], [place] [phone]   Planned Parenthood    [phone]   [facility]    [phone]   [place] and [place] [address], [place] Phone:  [phone], Fax:  [phone] Hours of Operation:  9 am - 6 pm, M-F.  Also accepts Medicaid/Medicare and self-pay.  [facility] for [place], [address], [place] Phone: [phone], Fax: [phone]. Hours of Operation:  8:30 am - 5:30 pm, M-F.  Also accepts Medicaid and self-pay.  [facility] [place] [address] [place] Phone: [phone]   [place], [place], [place] [phone], Ext. [phone] Mondays & Thursdays: 7-9 AM.  First 15 patients are seen on a first come, first serve basis.     [place] Providers:  Organization         Address  Phone   Notes  [facility] [address], [place] [phone] Also accepts self-pay patients.  [facility] [place] [address], [place]  [phone]   [place], [address], [place] [phone]   [facility] [address] [place] [phone]   Lucianne Lei [address], [place]   [phone] Only accepts [place] Access [place] patients after they have their name applied to their card.   Self-Pay (no insurance) in [facility]:  Organization         Address  Phone   Notes  Sickle Cell Patients, [facility] Internal [facility] [place] [phone]   [facility] [place] [phone]   [facility] [place]  [place], [place], [place] [phone]   [facility]/Dr. Osei-Bonsu  [address] [place] or [address], [place] [phone] Phone number for both [place] and [place] locations is the same.  [facility]gent Medical and [facility] [address], [place] [phone]   [facility] [address], [place] or [address] [phone]   [facility] [address], [place] [phone], phone; [phone], fax Sees patients 1st and 3rd Saturday of every month.  Must not qualify for public or private insurance (i.e. Medicaid, Medicare, [place] Health Choice, Veterans' Benefits)  Household income should be no more than 200% of the poverty level The clinic cannot treat you if you are pregnant or think you are pregnant  Sexually transmitted diseases are not treated at the clinic.    Dental Care: Organization         Address  Phone  Notes  [facility]  Department of [facility] [place] [phone] Accepts children up to age 57 who are enrolled in [place] or [place]; pregnant women with a Medicaid card; and children who have  applied for Medicaid or [place] Health Choice, but were declined, whose parents can pay a reduced fee at time of service.  [facility] Department of [facility]  [address], [place] [phone] Accepts children up to age 48 who are enrolled in [place] or [place]; pregnant women with a Medicaid card; and children who have applied for Medicaid or [employer] Health Choice, but were declined, whose parents can pay a reduced fee at time of service.  [place] Adult Dental Access PROGRAM  [place] [phone] Patients are seen by appointment only. Walk-ins are not accepted. [place] will see patients 65 years of age and older. Monday - Tuesday (8am-5pm) Most Wednesdays (8:30-5pm) $30 per visit, cash only  [facility] Adult Dental Access PROGRAM  [address], [facility] [phone] Patients are seen by appointment only. Walk-ins are not accepted. [place] will see patients 59 years of age and older. One Wednesday Evening (Monthly: Volunteer Based).  $30 per visit, cash only  [place]  [phone] for adults; Children under age 42, call Graduate Pediatric Dentistry at [phone]. Children aged 34-14, please call [phone] to request a pediatric application.  Dental services are provided in all areas of dental care including fillings, crowns and bridges, complete and partial dentures, implants, gum treatment, root canals, and extractions. Preventive care is also provided. Treatment is provided to both adults and children. Patients are selected via a lottery and there is often a waiting list.   [facility] [address] [place]  [phone]  [email or web address]   [facility] [address] [place], [place] [phone], Ext. [phone] Second and Fourth Thursday of each month, opens at 6:30 AM; Clinic ends at 9 AM.  Patients are seen on a first-come first-served basis, and a limited number are seen during each clinic.   [facility]  [address] Hillard Danker [place], [place] [phone]   Eligibility Requirements You must have lived in [place], [place], or [place] counties for at least the last three months.   You cannot be eligible for state or federal sponsored [employer], including [employer], [place], or [employer].   You generally cannot be eligible for healthcare insurance through your employer.    How to apply: Eligibility screenings are held every Tuesday and Wednesday afternoon from 1:00 pm until 4:00 pm. You do not need an appointment for the interview!  [facility] [address] [place], [place]   [place]  [place] Department  [place]  [phone]    Behavioral Health Resources in the Community: Intensive Outpatient Programs Organization         Address  Phone  Notes  [place]. [address], [place], [place] [phone]   [facility] Outpatient [address] [place], [place]   ADS: Alcohol & Drug Svcs [address] [place], [place]   [facility] [address]  [place], [place] or [phone]   Substance Abuse Resources Organization         Address  Phone  Notes  Alcohol and Drug Services  [place]  [phone]   [facility]   Chinita Pester  [phone]   Residential & Outpatient Substance Abuse  Program  [phone]   Psychological Services Organization          Address  Phone  Notes  [place]  [place]  [phone]   [place] [address] [place] or [phone]    Mobile Crisis Teams Organization         Address  Phone  Notes  [facility], Mobile Crisis Care Unit  [phone]   [facility]  [address] [place], [place]   [place] Levels [address] [place] [phone]    Self-Help/Support Groups Organization         Address  Phone             Notes  [place]. of [place] - variety of support groups  Bend Call for more information  Narcotics Anonymous (NA), [facility] [address], [employer] [place]  2 meetings at this location   Special educational needs teacher         Address  Phone  Notes  [facility] [place],    [place]  [phone]   [facility]  [address] [place] [address], [place], [place]   [place], [place] [phone] Admissions: 8am-3pm M-F  Incentives Substance [place] [address],    [place], [place] [phone]   [facility] [address] [place], [place], [place]   [facility] [address],  [place], [place]   [facility]ight Programs - Intensive Outpatient [place] Krist[address], [place], [place]   [facility] ([place] [place].,  [place], [place] [phone] or [phone]   [facility]idential Treatment Services (RTS) [address], [place], [place] Accepts Medicaid  [facility] [place] [address]  [place] [phone] Substance Abuse/Addiction Treatment   [facility] Organization         Address  Phone  Notes  [facility]  [phone]   Domenic Schwab, PhD [address] Arlis Porta [place], [place]   [phone] or [phone]   [place], [place] [phone]   [facility] [address] [place], [place] [phone] Insurance/Medicaid/sponsorship through [facility] and Families [address]                                    [place], [place] [phone] [place] [address], [place] [phone]    Dr. Adele Schilder  [phone]   [facility] [place] Dept. 1) [address], [place] 2) [place] 3)  [place] [address], [place] [phone]  [phone]   [place] 9046225284 or [phone] (After Hours)

## 2014-05-13 NOTE — ED Provider Notes (Signed)
CSN: [id number]     Arrival date & time 05/13/14  9983 History   First MD Initiated Contact with Patient 05/13/14 1110     Chief Complaint  Patient presents with  . Vaginal Discharge     (Consider location/radiation/quality/duration/timing/severity/associated sxs/prior Treatment) The history is provided by the patient. No language interpreter was used.  Martha Wood is a 43 y/o F with PMHx of obesity presenting to the ED with vaginal discharge that started this morning. Patient reported that she noticed a greenish vaginal discharge with a foul odor this morning, reported copious amounts. Patient stated that her boyfriend reported that he was positive for Gonorrhea this morning. Stated that she recently had sexual interactions with her boyfriend 2 days ago and last night. Reported that she does not use protection secondary to having a tubal ligation. Stated that she has been experiencing lower abdominal pain described as a light throbbing. Patient reported that she was seen and assessed in the ED setting on 05/08/2014 regarding right upper quadrant abdominal pain with nausea and vomiting - stated that she was diagnosed with cholelithiasis and discharged home with pain medications and antiemetics - reported that the pain has decreased, but is still present. Reported that the right upper quadrant abdominal pain started after eating a cheese sandwich the other day. Reported that her last LMP was a week and 2 days ago. Denied dysuria, fever, chills, nausea, vomiting, diarrhea, melena, hematochezia, weakness.  PCP none   Past Medical History  Diagnosis Date  . Obesity    Past Surgical History  Procedure Laterality Date  . Cesarean section     History reviewed. No pertinent family history. History  Substance Use Topics  . Smoking status: Current Every Day Smoker -- 0.50 packs/day    Types: Cigarettes  . Smokeless tobacco: Not on file  . Alcohol Use: No   OB History   Grav Para Term Preterm  Abortions TAB SAB Ect Mult Living                 Review of Systems  Constitutional: Negative for fever and chills.  Respiratory: Negative for chest tightness and shortness of breath.   Cardiovascular: Negative for chest pain.  Gastrointestinal: Positive for abdominal pain. Negative for nausea, vomiting, diarrhea, constipation, blood in stool and anal bleeding.  Genitourinary: Positive for vaginal discharge and pelvic pain. Negative for dysuria, hematuria, decreased urine volume, vaginal bleeding and vaginal pain.  Musculoskeletal: Negative for back pain, neck pain and neck stiffness.  Neurological: Negative for dizziness, weakness and headaches.      Allergies  Diphenhydramine; Codeine; Morphine and related; and Latex  Home Medications   Prior to Admission medications   Medication Sig Start Date End Date Taking? Authorizing Provider  ondansetron (ZOFRAN) 4 MG tablet Take 1 tablet (4 mg total) by mouth every 6 (six) hours. 05/08/14  Yes Larene Pickett, PA-C  oxyCODONE-acetaminophen (PERCOCET/ROXICET) 5-325 MG per tablet Take 1 tablet by mouth every 4 (four) hours as needed for severe pain.   Yes Historical Provider, MD  cephALEXin (KEFLEX) 500 MG capsule Take 1 capsule (500 mg total) by mouth 2 (two) times daily. 05/13/14   Troyce Febo, PA-C  doxycycline (VIBRAMYCIN) 100 MG capsule One po bid x 14 days 05/13/14   Marchell Froman, PA-C   BP 152/82  Pulse 92  Temp(Src) 97.7 F (36.5 C) (Oral)  Resp 20  Ht 5' 6.5" (1.689 m)  Wt 223 lb 9.6 oz (101.424 kg)  BMI 35.55 kg/m2  SpO2  100% Physical Exam  Nursing note and vitals reviewed. Constitutional: She is oriented to person, place, and time. She appears well-developed and well-nourished. No distress.  HENT:  Head: Normocephalic and atraumatic.  Mouth/Throat: Oropharynx is clear and moist. No oropharyngeal exudate.  Eyes: Conjunctivae and EOM are normal. Pupils are equal, round, and reactive to light. Right eye exhibits no  discharge. Left eye exhibits no discharge.  Neck: Normal range of motion. Neck supple. No tracheal deviation present.  Cardiovascular: Normal rate, regular rhythm and normal heart sounds.  Exam reveals no friction rub.   No murmur heard. Pulses:      Radial pulses are 2+ on the right side, and 2+ on the left side.  Cap refill < 3 seconds  Pulmonary/Chest: Effort normal and breath sounds normal. No respiratory distress. She has no wheezes. She has no rales.  Abdominal: Soft. Bowel sounds are normal. She exhibits no distension. There is tenderness. There is no rebound and no guarding.  Obese  Bowel sounds normoactive in all 4 quadrants Abdomen soft upon palpation Generalized tenderness to the lower abdomen Negative peritoneal signs Negative guarding or rigidity noted  Genitourinary: Vaginal discharge found.  Pelvic exam: Negative swelling, erythema, inflammation, lesions, sores, deformities identified to the external region. Negative swelling, erythema, inflammation, lesions, sores, deformities identified to the vaginal canal. Negative bright red blood. Thick greenish tinged discharge with a fishy odor identified in the vaginal canal. Cervix identify what negative friability-negative erythema, lesions, sores. Positive CMT and bilateral adnexal tenderness.  Musculoskeletal: Normal range of motion. She exhibits no edema and no tenderness.  Full ROM to upper and lower extremities without difficulty noted, negative ataxia noted.  Lymphadenopathy:    She has no cervical adenopathy.  Neurological: She is alert and oriented to person, place, and time. No cranial nerve deficit. She exhibits normal muscle tone. Coordination normal.  Cranial nerves III-XII grossly intact Strength 5+/5+ to upper and lower extremities bilaterally with resistance applied, equal distribution noted Equal grip strength bilaterally Negative facial droop Negative slurred speech Negative aphasia GCS 15 Gait proper, proper  balance - negative sway, negative drift, negative step-offs  Skin: Skin is warm and dry. No rash noted. She is not diaphoretic. No erythema.  Psychiatric: She has a normal mood and affect. Her behavior is normal. Thought content normal.    ED Course  Procedures (including critical care time)  Results for orders placed during the hospital encounter of 05/13/14  WET PREP, GENITAL      Result Value Ref Range   Yeast Wet Prep HPF POC NONE SEEN  NONE SEEN   Trich, Wet Prep NONE SEEN  NONE SEEN   Clue Cells Wet Prep HPF POC NONE SEEN  NONE SEEN   WBC, Wet Prep HPF POC MANY (*) NONE SEEN  URINALYSIS, ROUTINE W REFLEX MICROSCOPIC      Result Value Ref Range   Color, Urine AMBER (*) YELLOW   APPearance CLOUDY (*) CLEAR   Specific Gravity, Urine 1.024  1.005 - 1.030   pH 6.0  5.0 - 8.0   Glucose, UA NEGATIVE  NEGATIVE mg/dL   Hgb urine dipstick NEGATIVE  NEGATIVE   Bilirubin Urine SMALL (*) NEGATIVE   Ketones, ur 15 (*) NEGATIVE mg/dL   Protein, ur 100 (*) NEGATIVE mg/dL   Urobilinogen, UA 1.0  0.0 - 1.0 mg/dL   Nitrite POSITIVE (*) NEGATIVE   Leukocytes, UA MODERATE (*) NEGATIVE  URINE MICROSCOPIC-ADD ON      Result Value Ref Range  Squamous Epithelial / LPF MANY (*) RARE   WBC, UA TOO NUMEROUS TO COUNT  <3 WBC/hpf   Bacteria, UA MANY (*) RARE   Urine-Other MUCOUS PRESENT    CBC      Result Value Ref Range   WBC 11.3 (*) 4.0 - 10.5 K/uL   RBC 4.64  3.87 - 5.11 MIL/uL   Hemoglobin 9.5 (*) 12.0 - 15.0 g/dL   HCT 31.9 (*) 36.0 - 46.0 %   MCV 68.8 (*) 78.0 - 100.0 fL   MCH 20.5 (*) 26.0 - 34.0 pg   MCHC 29.8 (*) 30.0 - 36.0 g/dL   RDW 20.2 (*) 11.5 - 15.5 %   Platelets 479 (*) 150 - 400 K/uL  COMPREHENSIVE METABOLIC PANEL      Result Value Ref Range   Sodium 140  137 - 147 mEq/L   Potassium 3.4 (*) 3.7 - 5.3 mEq/L   Chloride 103  96 - 112 mEq/L   CO2 23  19 - 32 mEq/L   Glucose, Bld 84  70 - 99 mg/dL   BUN 8  6 - 23 mg/dL   Creatinine, Ser 0.84  0.50 - 1.10 mg/dL   Calcium  8.8  8.4 - 10.5 mg/dL   Total Protein 7.1  6.0 - 8.3 g/dL   Albumin 2.9 (*) 3.5 - 5.2 g/dL   AST 14  0 - 37 U/L   ALT 14  0 - 35 U/L   Alkaline Phosphatase 75  39 - 117 U/L   Total Bilirubin 0.4  0.3 - 1.2 mg/dL   GFR calc non Af Amer 83 (*) >90 mL/min   GFR calc Af Amer >90  >90 mL/min   Anion gap 14  5 - 15  LIPASE, BLOOD      Result Value Ref Range   Lipase 20  11 - 59 U/L  POC URINE PREG, ED      Result Value Ref Range   Preg Test, Ur NEGATIVE  NEGATIVE    Labs Review Labs Reviewed  WET PREP, GENITAL - Abnormal; Notable for the following:    WBC, Wet Prep HPF POC MANY (*)    All other components within normal limits  URINALYSIS, ROUTINE W REFLEX MICROSCOPIC - Abnormal; Notable for the following:    Color, Urine AMBER (*)    APPearance CLOUDY (*)    Bilirubin Urine SMALL (*)    Ketones, ur 15 (*)    Protein, ur 100 (*)    Nitrite POSITIVE (*)    Leukocytes, UA MODERATE (*)    All other components within normal limits  URINE MICROSCOPIC-ADD ON - Abnormal; Notable for the following:    Squamous Epithelial / LPF MANY (*)    Bacteria, UA MANY (*)    All other components within normal limits  CBC - Abnormal; Notable for the following:    WBC 11.3 (*)    Hemoglobin 9.5 (*)    HCT 31.9 (*)    MCV 68.8 (*)    MCH 20.5 (*)    MCHC 29.8 (*)    RDW 20.2 (*)    Platelets 479 (*)    All other components within normal limits  COMPREHENSIVE METABOLIC PANEL - Abnormal; Notable for the following:    Potassium 3.4 (*)    Albumin 2.9 (*)    GFR calc non Af Amer 83 (*)    All other components within normal limits  GC/CHLAMYDIA PROBE AMP  URINE CULTURE  LIPASE, BLOOD  HIV ANTIBODY (  ROUTINE TESTING)  POC URINE PREG, ED    Imaging Review US Abdomen Complete  05/13/2014   CLINICAL DATA:  Right upper quadrant pain  EXAM: ULTRASOUND ABDOMEN COMPLETE  COMPARISON:  05/08/2014  FINDINGS: Gallbladder:  A few small gallstones noted within the gallbladder, without evidence of wall  thickening. Negative sonographic Murphy's.  Common bile duct:  Diameter: Normal caliber, 5 mm.  Liver:  No focal lesion identified. Within normal limits in parenchymal echogenicity.  IVC:  No abnormality visualized.  Pancreas:  Visualized portion unremarkable.  Spleen:  Size and appearance within normal limits.  Right Kidney:  Length: 11.6 cm. Echogenicity within normal limits. No mass or hydronephrosis visualized.  Left Kidney:  Length: 13.2 cm. Echogenicity within normal limits. No mass or hydronephrosis visualized.  Abdominal aorta:  No aneurysm visualized.  Other findings:  None.  IMPRESSION: No acute abnormality.  Cholelithiasis.  No sonographic evidence of acute cholecystitis.   Electronically Signed   By: Rolm Baptise M.D.   On: 05/13/2014 14:00   US Transvaginal Non-ob  05/13/2014   CLINICAL DATA:  43 year old female with right flank pain. History of tubal ligation. Premenopausal, LMP 1 week ago. Initial encounter.  EXAM: TRANSABDOMINAL AND TRANSVAGINAL ULTRASOUND OF PELVIS  DOPPLER ULTRASOUND OF OVARIES  TECHNIQUE: Both transabdominal and transvaginal ultrasound examinations of the pelvis were performed. Transabdominal technique was performed for global imaging of the pelvis including uterus, ovaries, adnexal regions, and pelvic cul-de-sac.  It was necessary to proceed with endovaginal exam following the transabdominal exam to visualize the ovaries. Color and duplex Doppler ultrasound was utilized to evaluate blood flow to the ovaries.  COMPARISON:  CT Abdomen and Pelvis 05/08/2014. Abdomen ultrasound from today reported separately.  FINDINGS: Uterus  Measurements: 12.9 x 5.7 x 7.6 cm. Anteverted, difficult to visualize with transvaginal technique. No fibroids or other mass visualized.  Endometrium  Thickness: 4 mm.  No focal abnormality visualized.  Right ovary  Measurements: 4.6 x 2.3 x 2.9 cm. Normal appearance/no adnexal mass.  Left ovary  Measurements: 3.6 x 1.9 x 3.7 cm. Normal appearance/no  adnexal mass.  Pulsed Doppler evaluation of both ovaries demonstrates normal low-resistance arterial and venous waveforms.  Other findings  No free fluid.  IMPRESSION: Negative; normal ovaries without evidence of torsion.   Electronically Signed   By: Lars Pinks M.D.   On: 05/13/2014 15:04   US Pelvis Complete  05/13/2014   CLINICAL DATA:  43 year old female with right flank pain. History of tubal ligation. Premenopausal, LMP 1 week ago. Initial encounter.  EXAM: TRANSABDOMINAL AND TRANSVAGINAL ULTRASOUND OF PELVIS  DOPPLER ULTRASOUND OF OVARIES  TECHNIQUE: Both transabdominal and transvaginal ultrasound examinations of the pelvis were performed. Transabdominal technique was performed for global imaging of the pelvis including uterus, ovaries, adnexal regions, and pelvic cul-de-sac.  It was necessary to proceed with endovaginal exam following the transabdominal exam to visualize the ovaries. Color and duplex Doppler ultrasound was utilized to evaluate blood flow to the ovaries.  COMPARISON:  CT Abdomen and Pelvis 05/08/2014. Abdomen ultrasound from today reported separately.  FINDINGS: Uterus  Measurements: 12.9 x 5.7 x 7.6 cm. Anteverted, difficult to visualize with transvaginal technique. No fibroids or other mass visualized.  Endometrium  Thickness: 4 mm.  No focal abnormality visualized.  Right ovary  Measurements: 4.6 x 2.3 x 2.9 cm. Normal appearance/no adnexal mass.  Left ovary  Measurements: 3.6 x 1.9 x 3.7 cm. Normal appearance/no adnexal mass.  Pulsed Doppler evaluation of both ovaries demonstrates normal low-resistance arterial and venous  waveforms.  Other findings  No free fluid.  IMPRESSION: Negative; normal ovaries without evidence of torsion.   Electronically Signed   By: Lars Pinks M.D.   On: 05/13/2014 15:04   [place] Art/ven Flow Abd Pelv Doppler  05/13/2014   CLINICAL DATA:  43 year old female with right flank pain. History of tubal ligation. Premenopausal, LMP 1 week ago. Initial encounter.  EXAM:  TRANSABDOMINAL AND TRANSVAGINAL ULTRASOUND OF PELVIS  DOPPLER ULTRASOUND OF OVARIES  TECHNIQUE: Both transabdominal and transvaginal ultrasound examinations of the pelvis were performed. Transabdominal technique was performed for global imaging of the pelvis including uterus, ovaries, adnexal regions, and pelvic cul-de-sac.  It was necessary to proceed with endovaginal exam following the transabdominal exam to visualize the ovaries. Color and duplex Doppler ultrasound was utilized to evaluate blood flow to the ovaries.  COMPARISON:  CT Abdomen and Pelvis 05/08/2014. Abdomen ultrasound from today reported separately.  FINDINGS: Uterus  Measurements: 12.9 x 5.7 x 7.6 cm. Anteverted, difficult to visualize with transvaginal technique. No fibroids or other mass visualized.  Endometrium  Thickness: 4 mm.  No focal abnormality visualized.  Right ovary  Measurements: 4.6 x 2.3 x 2.9 cm. Normal appearance/no adnexal mass.  Left ovary  Measurements: 3.6 x 1.9 x 3.7 cm. Normal appearance/no adnexal mass.  Pulsed Doppler evaluation of both ovaries demonstrates normal low-resistance arterial and venous waveforms.  Other findings  No free fluid.  IMPRESSION: Negative; normal ovaries without evidence of torsion.   Electronically Signed   By: Lars Pinks M.D.   On: 05/13/2014 15:04     EKG Interpretation None      MDM   Final diagnoses:  STD exposure  PID (acute pelvic inflammatory disease)  UTI (lower urinary tract infection)    Medications  lidocaine (PF) (XYLOCAINE) 1 % injection (not administered)  cefTRIAXone (ROCEPHIN) injection 250 mg (250 mg Intramuscular Given 05/13/14 1541)  azithromycin (ZITHROMAX) tablet 1,000 mg (1,000 mg Oral Given 05/13/14 1541)  potassium chloride SA (K-DUR,KLOR-CON) CR tablet 40 mEq (40 mEq Oral Given 05/13/14 1541)  lidocaine (PF) (XYLOCAINE) 1 % injection 5 mL (5 mLs Other Given 05/13/14 1545)    Filed Vitals:   05/13/14 1230 05/13/14 1300 05/13/14 1528 05/13/14 1557  BP:  140/74 122/83 136/62 152/82  Pulse: 61 65 92   Temp:    97.7 F (36.5 C)  TempSrc:    Oral  Resp:      Height:      Weight:      SpO2: 96% 97% 100%    This provider reviewed patient's chart. Patient seen and assessed in ED setting on 05/08/2014 regarding abdominal pain for CT abdomen and pelvis with contrast was performed with findings consistent cholelithiasis without evidence of acute cholecystitis. CBC mildly elevated white blood cell count of 11.3. Elevated platelets of 479. CMP unremarkable-AST, ALT, alkaline phosphatase and bilirubin negative elevation. Lipase negative elevation. Urine pregnancy negative. Urinalysis noted small bilirubin with positive nitrites and moderate leukocytes with numerous white blood cell count and many bacteria-urine culture pending. Wet prep identified many white blood cells-negative trichomoniasis and clue cells noted. US abdomen noted cholelithiasis without evidence of acute cholecystitis. Pelvic US negative for acute abnormalities - negative findings of ovarian torsion. Negative findings of tubo-ovarian abscess. Negative findings of torsion. Doubt pancreatitis. doubt appendicitis. Suspicion to be UTI and PID. Patient treated prophylactically while in the ED setting for STD, due to alleged exposure. Discharged patient with antibiotics for UTI and PID. Referred patient to [facility]. Discussed with  patient to avoid any sexual activity. Discussed with patient to closely monitor symptoms and if symptoms are to worsen or change to report back to the ED - strict return instructions given.  Patient agreed to plan of care, understood, all questions answered.   Jamse Mead, PA-C 05/13/14 1653

## 2014-05-13 NOTE — ED Notes (Signed)
Patient transported to Ultrasound 

## 2014-05-13 NOTE — ED Notes (Signed)
Pt c/o green vaginal discharge this am; pt sts boyfriend told her he has gonorrhea; pt sts seen at [facility] for gall stone on 8/23

## 2014-05-13 NOTE — ED Provider Notes (Signed)
Medical screening examination/treatment/procedure(s) were performed by non-physician practitioner and as supervising physician I was immediately available for consultation/collaboration.   EKG Interpretation None       Ezequiel Essex, MD 05/13/14 [phone]

## 2014-05-14 LAB — GC/CHLAMYDIA PROBE AMP
CT PROBE, AMP APTIMA: NEGATIVE
GC PROBE AMP APTIMA: POSITIVE — AB

## 2014-05-14 LAB — HIV ANTIBODY (ROUTINE TESTING W REFLEX): HIV: NONREACTIVE

## 2014-05-15 ENCOUNTER — Telehealth (HOSPITAL_BASED_OUTPATIENT_CLINIC_OR_DEPARTMENT_OTHER): Payer: Self-pay | Admitting: Emergency Medicine

## 2014-05-15 NOTE — Telephone Encounter (Signed)
Post ED Visit - Positive Culture Follow-up  Positive Gonorrhea* culture Treated with Rocephin, Zithromax, Doxycycline, organism sensitive to the same and no further patient follow-up is required at this time. [employer] faxed  05/15/14 @ [phone] - attempt to contact patient, left voicemail to call flow managers #  Ernesta Amble 05/15/2014, 10:27 AM

## 2014-05-15 NOTE — Telephone Encounter (Signed)
pt returned call, ID verified x three. Patient notified of Positive gonorrhea culture and that treatment was given while in ED with Rocephin, Zithromax and prescription for Doxycycline. STD instructions provided - patient verbalized understanding

## 2014-05-16 ENCOUNTER — Telehealth (HOSPITAL_BASED_OUTPATIENT_CLINIC_OR_DEPARTMENT_OTHER): Payer: Self-pay

## 2014-05-16 LAB — URINE CULTURE: SPECIAL REQUESTS: NORMAL

## 2014-05-16 NOTE — Telephone Encounter (Signed)
Post ED Visit - Positive Culture Follow-up  Culture report reviewed by antimicrobial stewardship pharmacist: []  [name], Pharm.D., BCPS []  [name], Pharm.D., BCPS []  [name], Pharm.D., BCPS []  [place], Pharm.D., BCPS, AAHIVP []  [name], Pharm.D., BCPS, AAHIVP []  [name], Pharm.D. [x]  [name], [place].D.  Positive Urine culture, >/= 100,000 colonies -> E Coli Treated with Cephalexin , organism sensitive to the same and no further patient follow-up is required at this time.  Dortha Kern 05/16/2014, 10:34 PM

## 2014-05-17 ENCOUNTER — Telehealth (HOSPITAL_COMMUNITY): Payer: Self-pay

## 2014-05-17 NOTE — ED Notes (Signed)
Post ED Visit - Positive Culture Follow-up  Culture report reviewed by antimicrobial stewardship pharmacist: []  [name], Pharm.D., BCPS [x]  [name], Pharm.D., BCPS []  [name], Pharm.D., BCPS []  [place], Pharm.D., BCPS, AAHIVP []  [name], Pharm.D., BCPS, AAHIVP []  [name], Pharm.D. []  [name], [place].D.  Positive urine culture Treated with cephalexin, organism sensitive to the same and no further patient follow-up is required at this time.  Martha Wood 05/17/2014, 12:51 PM

## 2014-05-30 ENCOUNTER — Emergency Department (HOSPITAL_COMMUNITY)
Admission: EM | Admit: 2014-05-30 | Discharge: 2014-05-31 | Disposition: A | Discharge status: Home / Self Care | Payer: Self-pay | Attending: Emergency Medicine | Admitting: Emergency Medicine

## 2014-05-30 ENCOUNTER — Encounter (HOSPITAL_COMMUNITY): Payer: Self-pay | Admitting: Emergency Medicine

## 2014-05-30 DIAGNOSIS — Z9889 Other specified postprocedural states: Secondary | ICD-10-CM | POA: Insufficient documentation

## 2014-05-30 DIAGNOSIS — Z91040 Latex allergy status: Secondary | ICD-10-CM | POA: Insufficient documentation

## 2014-05-30 DIAGNOSIS — F17200 Nicotine dependence, unspecified, uncomplicated: Secondary | ICD-10-CM | POA: Insufficient documentation

## 2014-05-30 DIAGNOSIS — R700 Elevated erythrocyte sedimentation rate: Secondary | ICD-10-CM | POA: Insufficient documentation

## 2014-05-30 DIAGNOSIS — E669 Obesity, unspecified: Secondary | ICD-10-CM | POA: Insufficient documentation

## 2014-05-30 DIAGNOSIS — R1011 Right upper quadrant pain: Secondary | ICD-10-CM | POA: Insufficient documentation

## 2014-05-30 DIAGNOSIS — K8020 Calculus of gallbladder without cholecystitis without obstruction: Secondary | ICD-10-CM | POA: Insufficient documentation

## 2014-05-30 NOTE — ED Notes (Signed)
Bed: WA21 Expected date: 05/30/14 Expected time: 10:39 PM Means of arrival: Ambulance Comments: 43 yo F  abd pain

## 2014-05-30 NOTE — ED Notes (Signed)
Per EMS, pt has had 10/10 RUQ since 9PM. Pt states she has had n/v/d since ~5 minutes after the pain in her abdomen began. Pt states she has had similar pain twice in the past, which turned out to be gallstones. Pt given 1mcg fentanyl, now states pain is 7.5/10. Pt A&Ox4

## 2014-05-31 ENCOUNTER — Emergency Department (HOSPITAL_COMMUNITY): Payer: Self-pay

## 2014-05-31 LAB — COMPREHENSIVE METABOLIC PANEL
ALK PHOS: 61 U/L (ref 39–117)
ALT: 11 U/L (ref 0–35)
ANION GAP: 12 (ref 5–15)
AST: 12 U/L (ref 0–37)
Albumin: 3.1 g/dL — ABNORMAL LOW (ref 3.5–5.2)
BUN: 16 mg/dL (ref 6–23)
CHLORIDE: 105 meq/L (ref 96–112)
CO2: 22 mEq/L (ref 19–32)
Calcium: 8.7 mg/dL (ref 8.4–10.5)
Creatinine, Ser: 0.61 mg/dL (ref 0.50–1.10)
GFR calc Af Amer: >90 mL/min (ref >90)
GFR calc non Af Amer: >90 mL/min (ref >90)
GLUCOSE: 94 mg/dL (ref 70–99)
POTASSIUM: 4.2 meq/L (ref 3.7–5.3)
SODIUM: 139 meq/L (ref 137–147)
TOTAL PROTEIN: 6.7 g/dL (ref 6.0–8.3)
Total Bilirubin: <0.2 mg/dL — ABNORMAL LOW (ref 0.3–1.2)

## 2014-05-31 LAB — LIPASE, BLOOD: Lipase: 38 U/L (ref 11–59)

## 2014-05-31 MED ORDER — SODIUM CHLORIDE 0.9 % IV BOLUS (SEPSIS)
1000.0000 mL | Freq: Once | INTRAVENOUS | Status: AC
  Administered 2014-05-31: 1000 mL via INTRAVENOUS

## 2014-05-31 MED ORDER — OXYCODONE-ACETAMINOPHEN 5-325 MG PO TABS: 1.0000 | ORAL_TABLET | Freq: Four times a day (QID) | ORAL | Status: DC | PRN

## 2014-05-31 NOTE — Discharge Instructions (Signed)
You have gall stones. If the pain is severe, and not getting better, come to the ER. Please call the Surgery team as soon as you can.   Biliary Colic  Biliary colic is a steady or irregular pain in the upper abdomen. It is usually under the right side of the rib cage. It happens when gallstones interfere with the normal flow of bile from the gallbladder. Bile is a liquid that helps to digest fats. Bile is made in the liver and stored in the gallbladder. When you eat a meal, bile passes from the gallbladder through the cystic duct and the common bile duct into the small intestine. There, it mixes with partially digested food. If a gallstone blocks either of these ducts, the normal flow of bile is blocked. The muscle cells in the bile duct contract forcefully to try to move the stone. This causes the pain of biliary colic.  SYMPTOMS   A person with biliary colic usually complains of pain in the upper abdomen. This pain can be:  In the center of the upper abdomen just below the breastbone.  In the upper-right part of the abdomen, near the gallbladder and liver.  Spread back toward the right shoulder blade.  Nausea and vomiting.  The pain usually occurs after eating.  Biliary colic is usually triggered by the digestive system's demand for bile. The demand for bile is high after fatty meals. Symptoms can also occur when a person who has been fasting suddenly eats a very large meal. Most episodes of biliary colic pass after 1 to 5 hours. After the most intense pain passes, your abdomen may continue to ache mildly for about 24 hours. DIAGNOSIS  After you describe your symptoms, your caregiver will perform a physical exam. He or she will pay attention to the upper right portion of your belly (abdomen). This is the area of your liver and gallbladder. An ultrasound will help your caregiver look for gallstones. Specialized scans of the gallbladder may also be done. Blood tests may be done, especially if  you have fever or if your pain persists. PREVENTION  Biliary colic can be prevented by controlling the risk factors for gallstones. Some of these risk factors, such as heredity, increasing age, and pregnancy are a normal part of life. Obesity and a high-fat diet are risk factors you can change through a healthy lifestyle. Women going through menopause who take hormone replacement therapy (estrogen) are also more likely to develop biliary colic. TREATMENT   Pain medication may be prescribed.  You may be encouraged to eat a fat-free diet.  If the first episode of biliary colic is severe, or episodes of colic keep retuning, surgery to remove the gallbladder (cholecystectomy) is usually recommended. This procedure can be done through small incisions using an instrument called a laparoscope. The procedure often requires a brief stay in the hospital. Some people can leave the hospital the same day. It is the most widely used treatment in people troubled by painful gallstones. It is effective and safe, with no complications in more than 90% of cases.  If surgery cannot be done, medication that dissolves gallstones may be used. This medication is expensive and can take months or years to work. Only small stones will dissolve.  Rarely, medication to dissolve gallstones is combined with a procedure called shock-wave lithotripsy. This procedure uses carefully aimed shock waves to break up gallstones. In many people treated with this procedure, gallstones form again within a few years. PROGNOSIS  If  gallstones block your cystic duct or common bile duct, you are at risk for repeated episodes of biliary colic. There is also a 25% chance that you will develop a gallbladder infection(acute cholecystitis), or some other complication of gallstones within 10 to 20 years. If you have surgery, schedule it at a time that is convenient for you and at a time when you are not sick. HOME CARE INSTRUCTIONS   Drink plenty of  clear fluids.  Avoid fatty, greasy or fried foods, or any foods that make your pain worse.  Take medications as directed. SEEK MEDICAL CARE IF:   You develop a fever over 100.5 F (38.1 C).  Your pain gets worse over time.  You develop nausea that prevents you from eating and drinking.  You develop vomiting. SEEK IMMEDIATE MEDICAL CARE IF:   You have continuous or severe belly (abdominal) pain which is not relieved with medications.  You develop nausea and vomiting which is not relieved with medications.  You have symptoms of biliary colic and you suddenly develop a fever and shaking chills. This may signal cholecystitis. Call your caregiver immediately.  You develop a yellow color to your skin or the white part of your eyes (jaundice). Document Released: 02/03/2006 Document Revised: 11/25/2011 Document Reviewed: 04/14/2008 [facility] Patient Information 2015 [place], [place]. This information is not intended to replace advice given to you by your health care provider. Make sure you discuss any questions you have with your health care provider.

## 2014-05-31 NOTE — ED Provider Notes (Signed)
CSN: [id number]     Arrival date & time 05/30/14  2305 History   First MD Initiated Contact with Patient 05/31/14 [phone]     Chief Complaint  Patient presents with  . Abdominal Pain     (Consider location/radiation/quality/duration/timing/severity/associated sxs/prior Treatment) Patient is a 42 y.o. female presenting with abdominal pain. The history is provided by the patient.  Abdominal Pain Pain location:  RUQ Pain quality: sharp, shooting and stabbing   Pain radiates to:  Periumbilical region and epigastric region Pain severity:  Severe Timing:  Constant Progression:  Waxing and waning Chronicity:  Recurrent Relieved by:  Nothing Worsened by:  Nothing tried Associated symptoms: nausea and vomiting   Associated symptoms: no chest pain, no dysuria, no hematuria and no shortness of breath   Risk factors: obesity   Risk factors: no alcohol abuse, has not had multiple surgeries, no NSAID use and not pregnant     Past Medical History  Diagnosis Date  . Obesity    Past Surgical History  Procedure Laterality Date  . Cesarean section     No family history on file. History  Substance Use Topics  . Smoking status: Current Every Day Smoker -- 0.50 packs/day    Types: Cigarettes  . Smokeless tobacco: Not on file  . Alcohol Use: No   OB History   Grav Para Term Preterm Abortions TAB SAB Ect Mult Living                 Review of Systems  Constitutional: Positive for activity change.  Respiratory: Negative for shortness of breath.   Cardiovascular: Negative for chest pain.  Gastrointestinal: Positive for nausea, vomiting and abdominal pain.  Genitourinary: Negative for dysuria, hematuria and flank pain.  Musculoskeletal: Negative for neck pain.  Neurological: Negative for headaches.      Allergies  Diphenhydramine; Codeine; Morphine and related; and Latex  Home Medications   Prior to Admission medications   Not on File   BP 127/62  Pulse 57  Temp(Src) 98.6 F  (37 C) (Oral)  Resp 14  SpO2 98%  LMP 05/30/2014 Physical Exam  Nursing note and vitals reviewed. Constitutional: She is oriented to person, place, and time. She appears well-developed and well-nourished.  HENT:  Head: Normocephalic and atraumatic.  Eyes: EOM are normal. Pupils are equal, round, and reactive to light.  Neck: Neck supple.  Cardiovascular: Normal rate, regular rhythm and normal heart sounds.   No murmur heard. Pulmonary/Chest: Effort normal. No respiratory distress.  Abdominal: Soft. She exhibits no distension. There is no tenderness. There is no rebound and no guarding.  Neurological: She is alert and oriented to person, place, and time.  Skin: Skin is warm and dry.    ED Course  Procedures (including critical care time) Labs Review Labs Reviewed  COMPREHENSIVE METABOLIC PANEL - Abnormal; Notable for the following:    Albumin 3.1 (*)    Total Bilirubin <0.2 (*)    All other components within normal limits  LIPASE, BLOOD  CBC WITH DIFFERENTIAL    Imaging Review No results found.   EKG Interpretation   Date/Time:  Monday May 30 2014 23:08:50 EDT Ventricular Rate:  54 PR Interval:  154 QRS Duration: 88 QT Interval:  437 QTC Calculation: 414 R Axis:   74 Text Interpretation:  Sinus rhythm No acute findings Confirmed by  Kathrynn Humble, MD, Allen Basista ([id number]) on 05/31/2014 5:25:13 AM      MDM   Final diagnoses:  None    Pt comes in  with cc of abd pain. Hx of cholelithiasis, with typical pain. Pt on exam has RUQ pain and tenderness, although, her pain has improved over the course of several minutes that she has been waiting in the ER. Basic labs and [place] ordered. If results normal, she will be reassessed, and if the pain is tolerable, pt will be discharged with Gen surgery f/u.  Varney Biles, MD 05/31/14 [phone]

## 2015-11-25 IMAGING — CT CT ABD-PELV W/ CM
1 of 3 series · 14 of 32 positions shown, 19 images · IV contrast (OMNIPAQUE 300)
Comparison: None.

CLINICAL DATA: Abdominal pain, nausea and vomiting.

EXAM:
CT ABDOMEN AND PELVIS WITH CONTRAST
TECHNIQUE: Multidetector CT imaging of the abdomen and pelvis was performed
using the standard protocol following bolus administration of
intravenous contrast.
CONTRAST:  50mL OMNIPAQUE IOHEXOL 300 MG/ML SOLN, 100mL OMNIPAQUE
IOHEXOL 300 MG/ML SOLN

[Series 2: abd/pel with · axial · 0.83mm/px · z∈[-316,+104]mm · 14 of 96 slices shown, 19 images]
[im 6/96  soft-tissue]
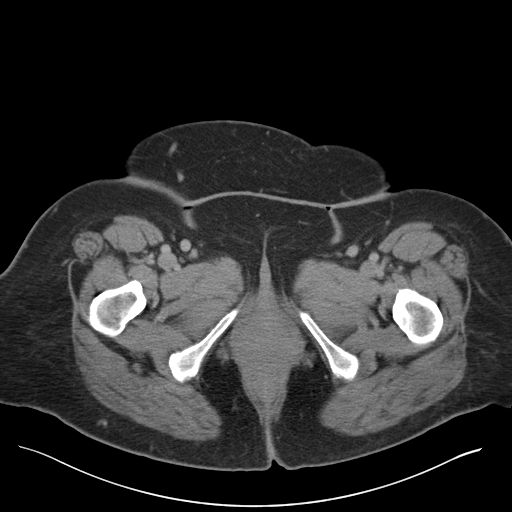
[im 6/96  bone]
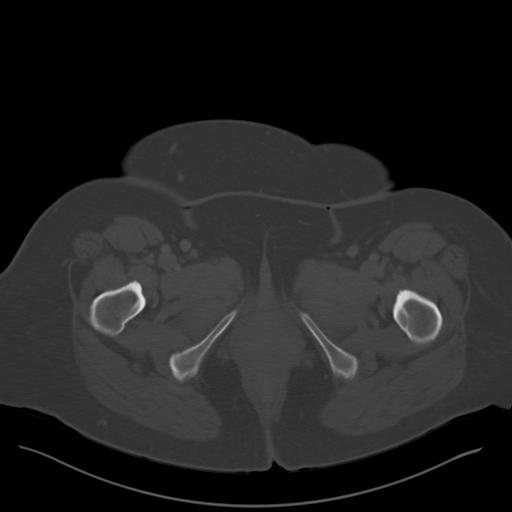
[im 11/96  soft-tissue]
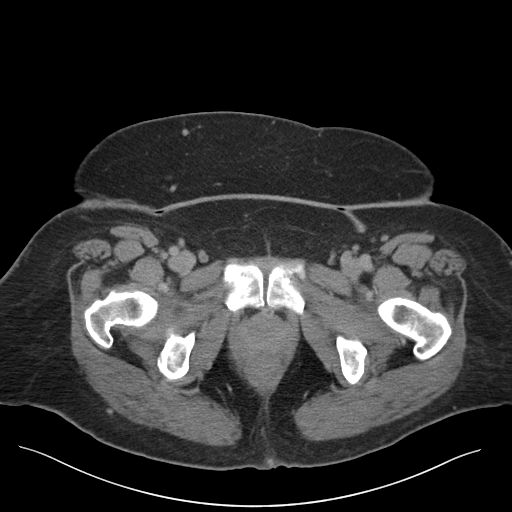
[im 22/96  soft-tissue]
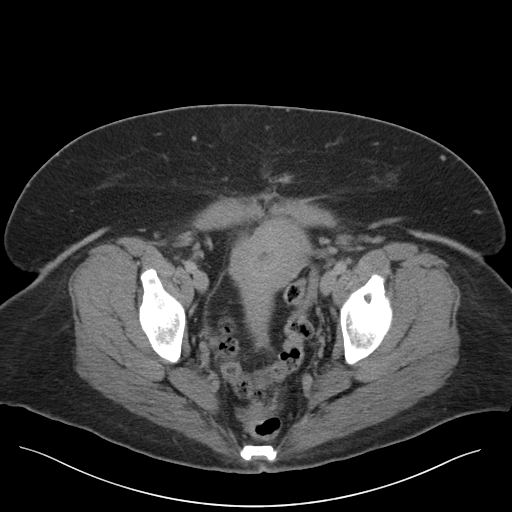
[im 27/96  soft-tissue]
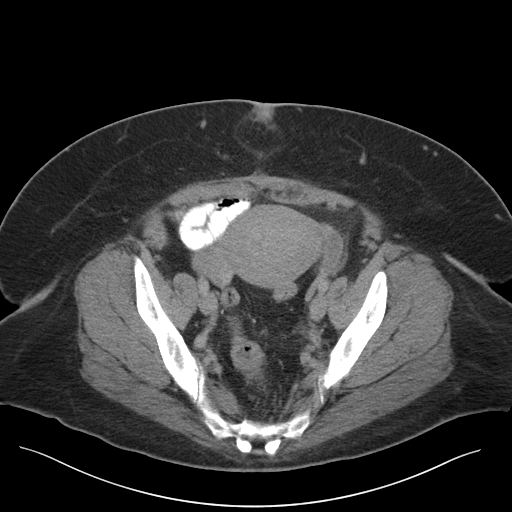
[im 32/96  soft-tissue]
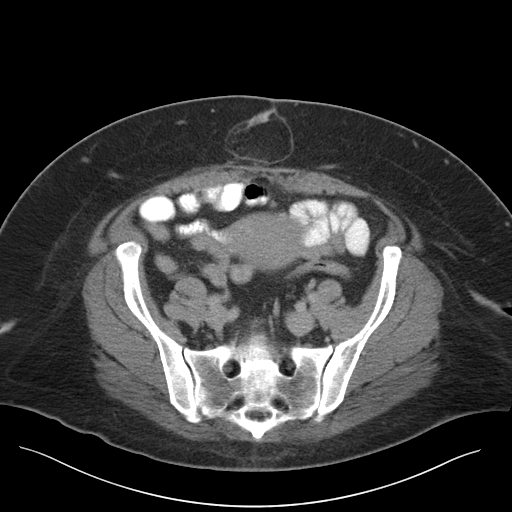
[im 43/96  soft-tissue]
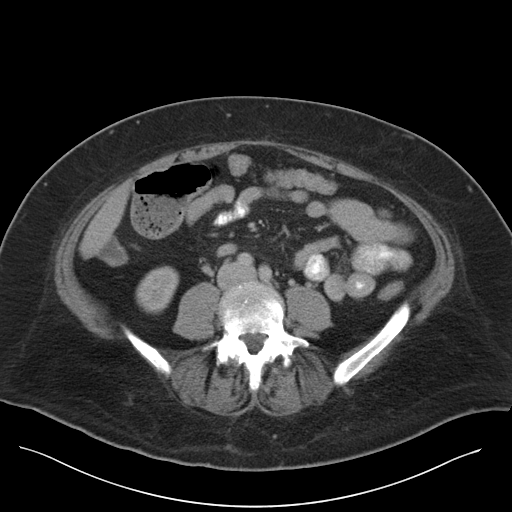
[im 48/96  soft-tissue]
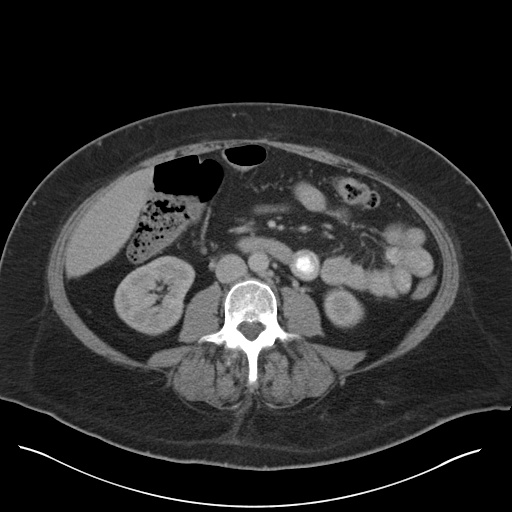
[im 53/96  soft-tissue]
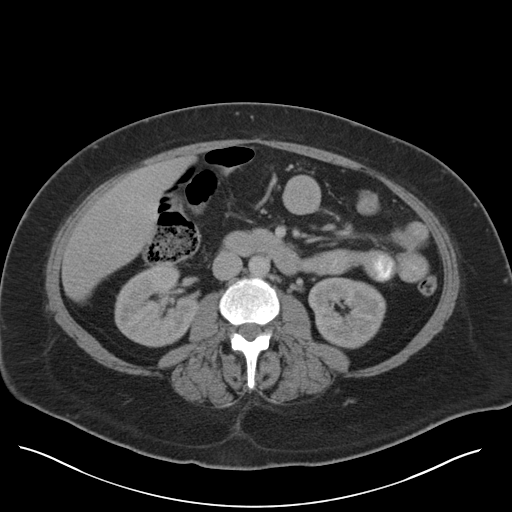
[im 64/96  soft-tissue]
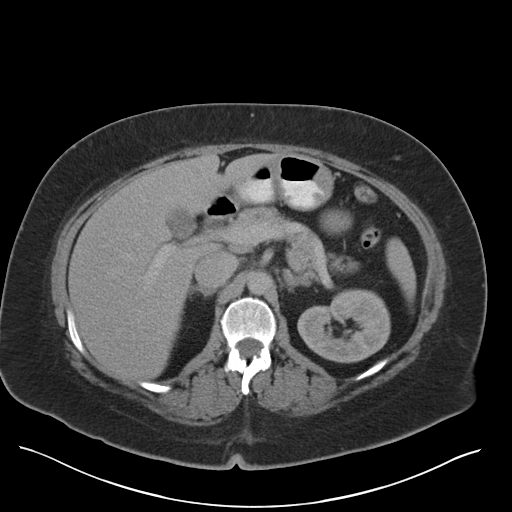
[im 64/96  bone]
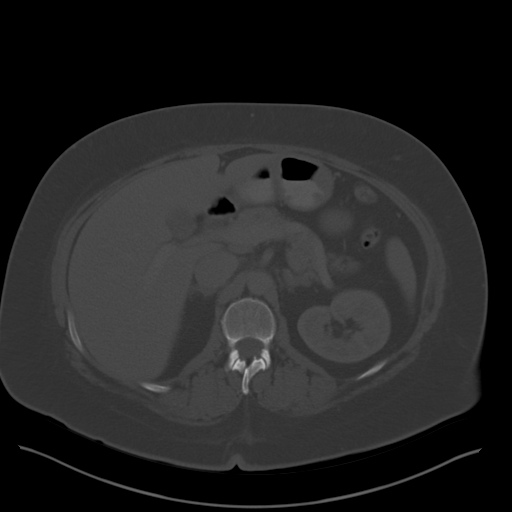
[im 69/96  soft-tissue]
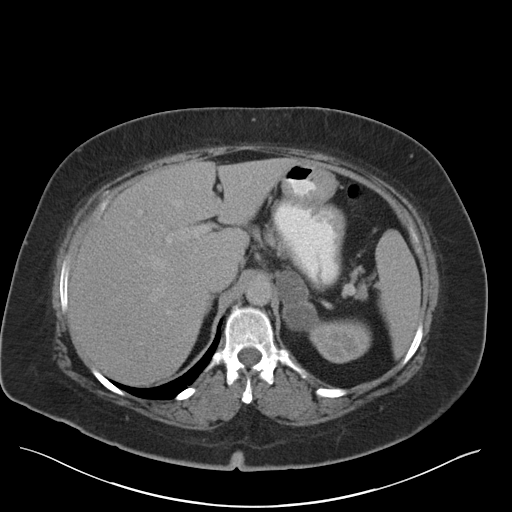
[im 74/96  soft-tissue]
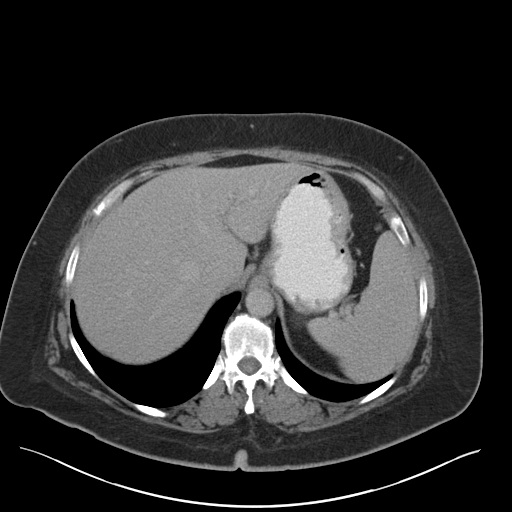
[im 74/96  lung]
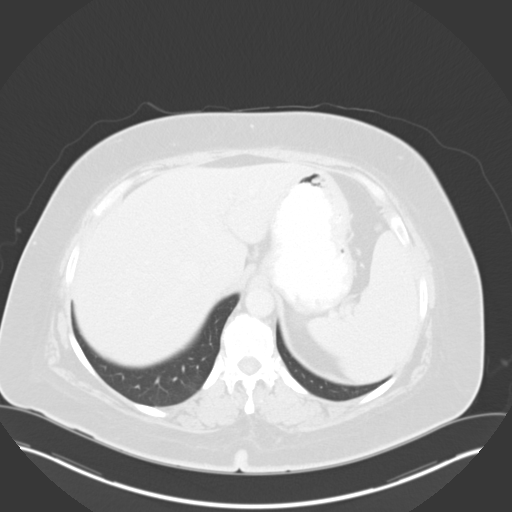
[im 80/96  lung]
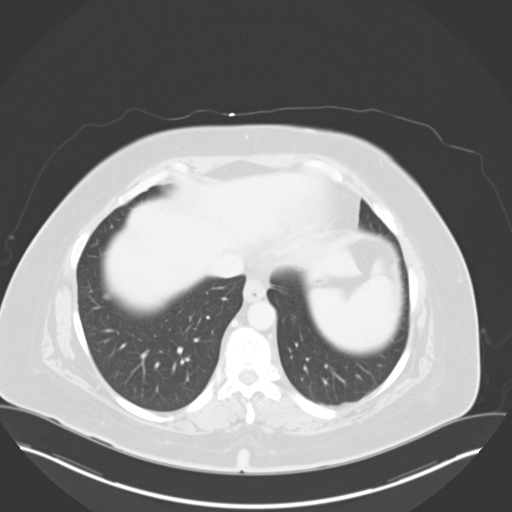
[im 85/96  soft-tissue]
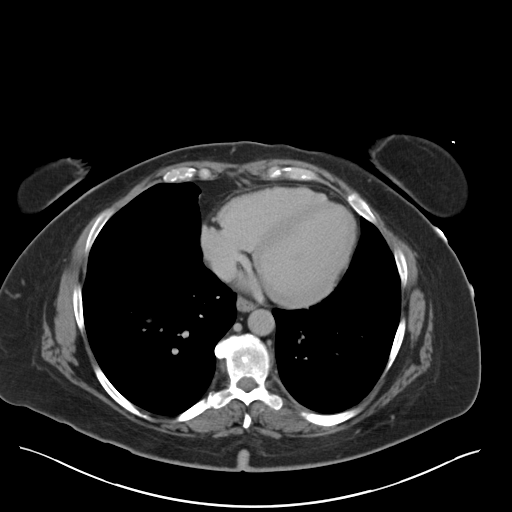
[im 85/96  lung]
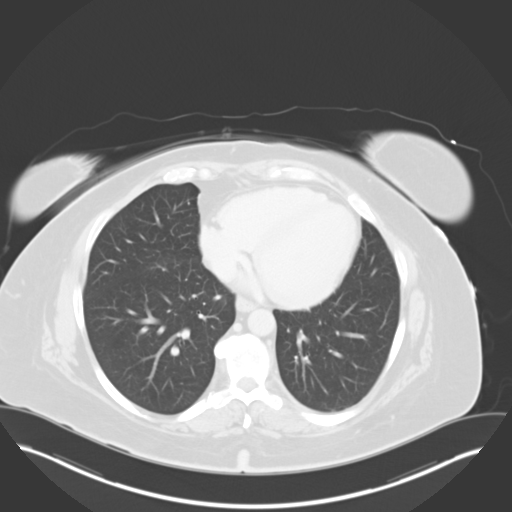
[im 90/96  soft-tissue]
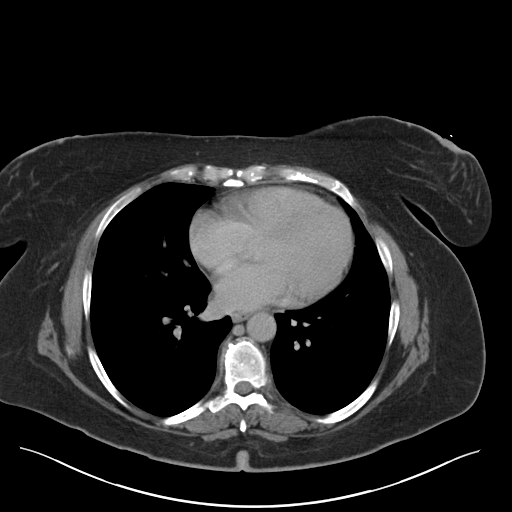
[im 90/96  lung]
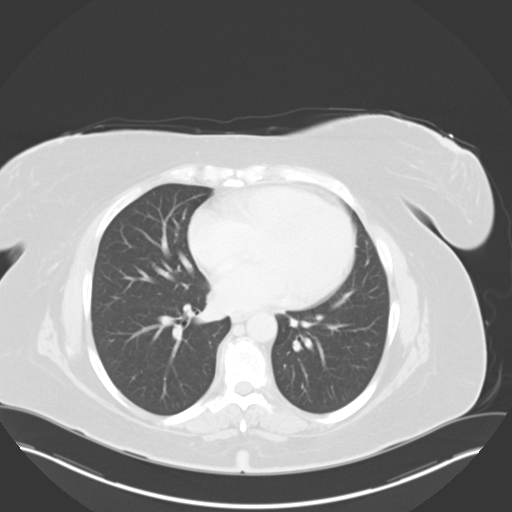

[14 of 32 positions shown; findings below may reference images not displayed]

FINDINGS: Umbilical hernia containing fat. Two low density left adrenal
masses. The more inferior mass measures 13 Hounsfield units with
some peripheral soft tissue components and measures 2.8 x 2.1 cm on
image number 29. The more superior mass measures 6 Hounsfield units
in density and 3.2 x 3.2 cm on image number 26.

Minimal diffuse low density of the liver relative to the spleen. 5
mm gallstone in the gallbladder. No gallbladder wall thickening or
pericholecystic fluid. Unremarkable spleen, pancreas, right adrenal
gland, kidney, urinary bladder, uterus and ovaries. Minimal free
peritoneal fluid in the pelvic cul-de-sac.

No gastrointestinal abnormalities or enlarged lymph nodes. Clear
lung bases. Lumbar and lower thoracic spine degenerative changes.
IMPRESSION: 1. No acute abnormality.
2. Umbilical hernia containing fat.
3. Probable left adrenal adenomas or old areas of hemorrhage.
4. Cholelithiasis without evidence of cholecystitis.
5. Minimal diffuse hepatic steatosis.

## 2015-11-30 IMAGING — US US ABDOMEN COMPLETE
1 series · 14 of 25 positions shown · non-contrast
Comparison: 05/08/2014

CLINICAL DATA: Right upper quadrant pain

EXAM:
ULTRASOUND ABDOMEN COMPLETE

[Series 1: us abdomen complete · 0.25mm/px · 14 of 80 slices shown]
[im 1/80]
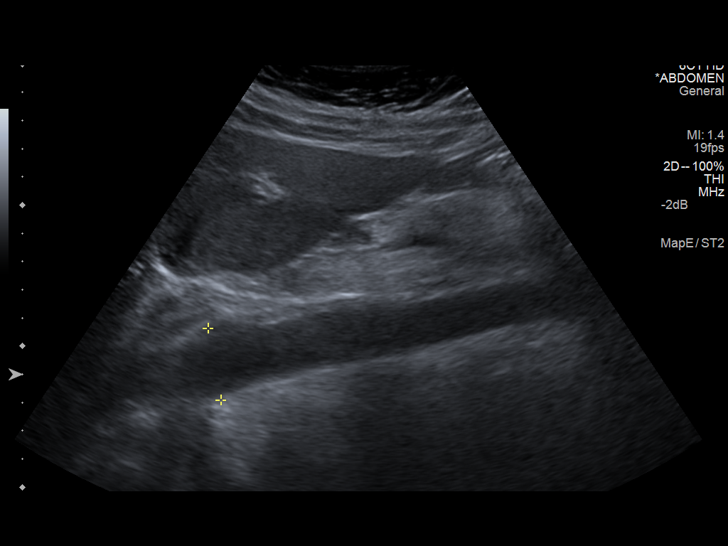
[im 7/80]
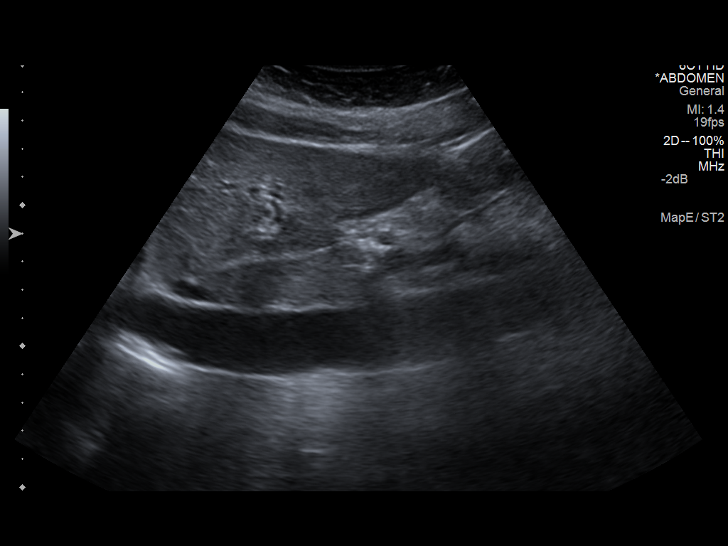
[im 14/80]
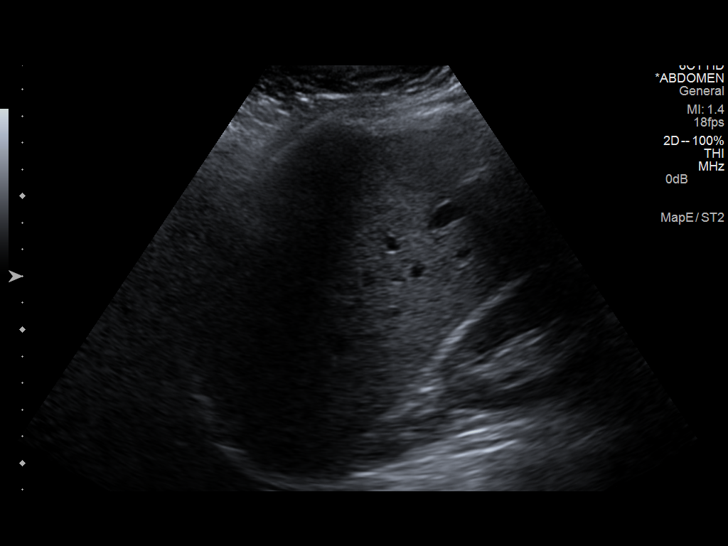
[im 20/80]
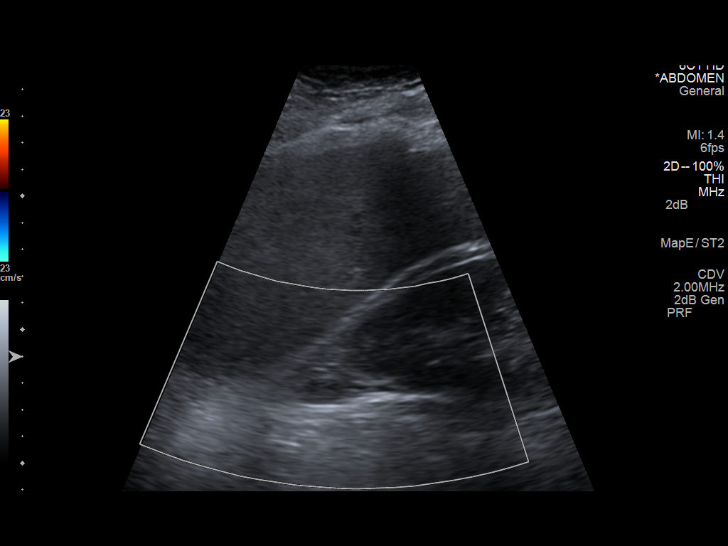
[im 27/80]
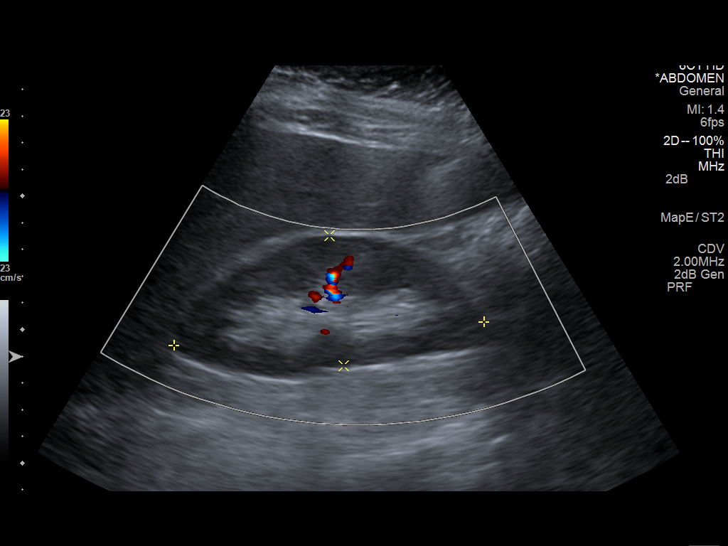
[im 30/80]
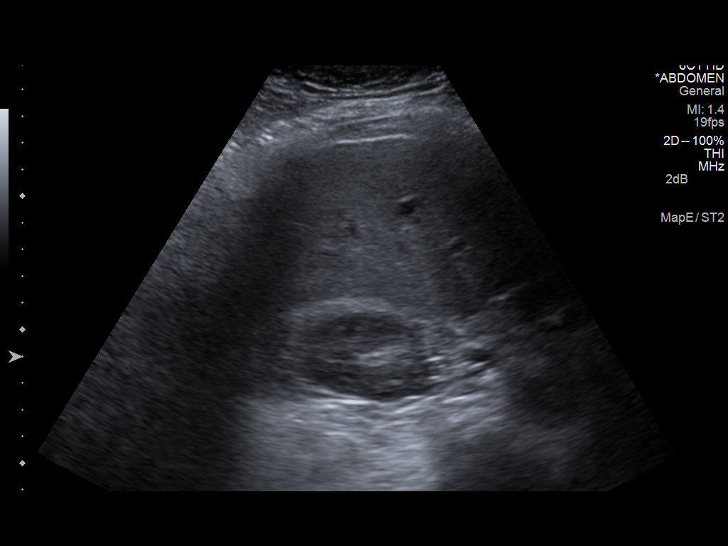
[im 37/80]
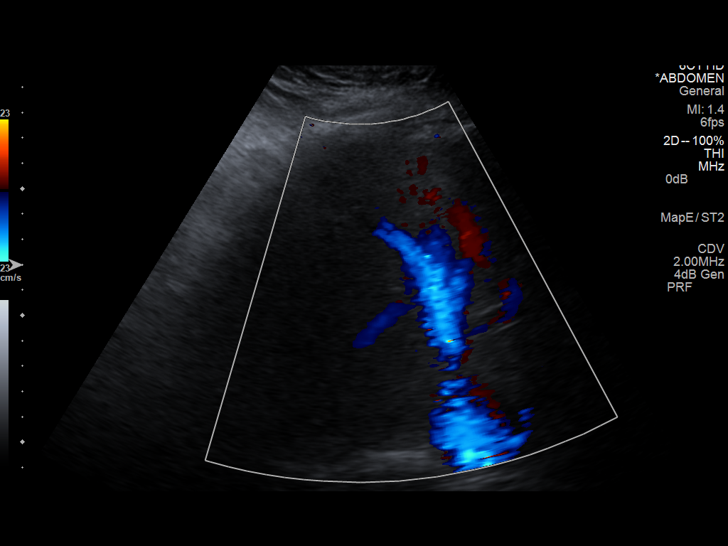
[im 43/80]
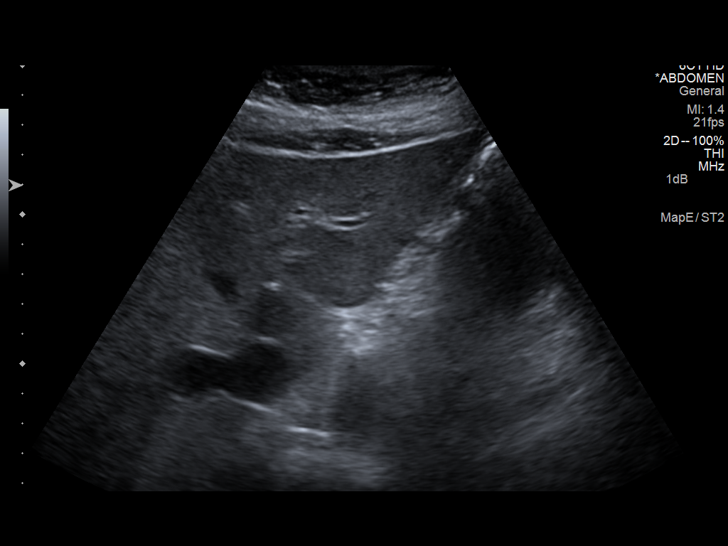
[im 50/80]
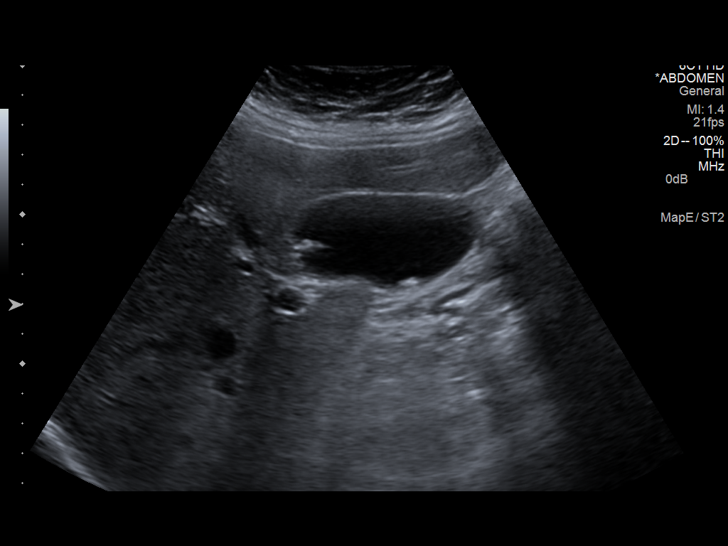
[im 53/80]
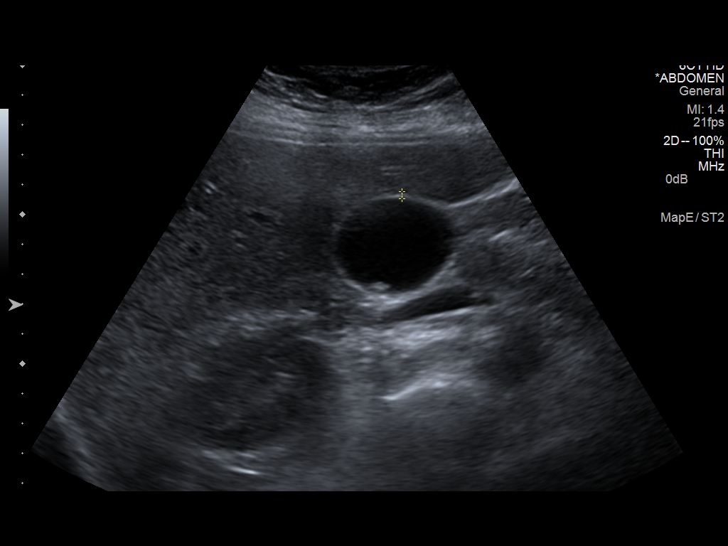
[im 60/80]
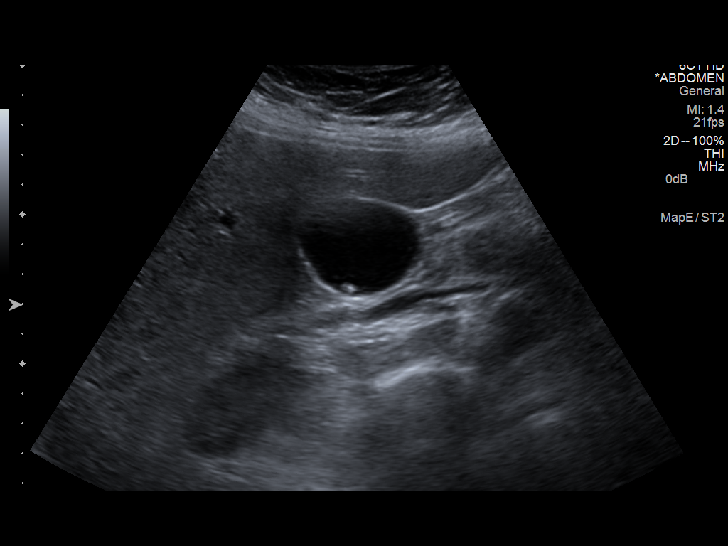
[im 66/80]
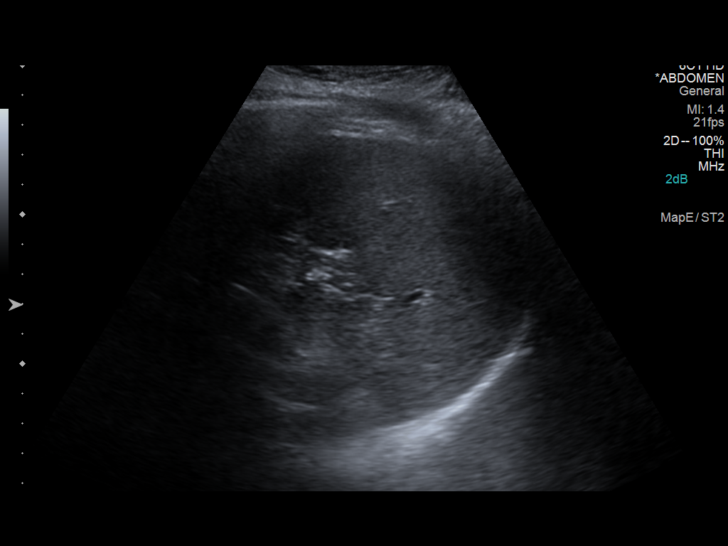
[im 73/80]
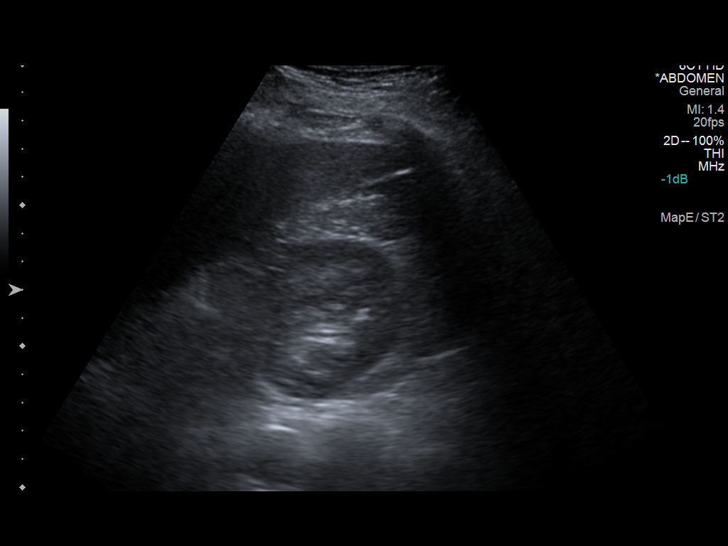
[im 80/80]
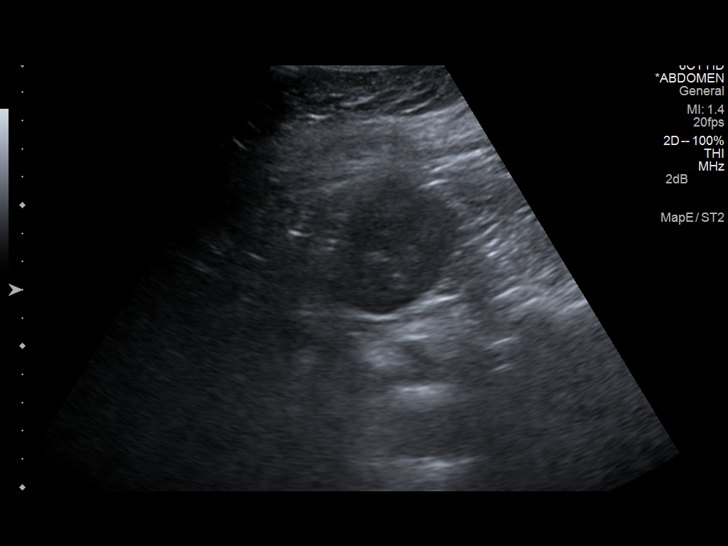

[14 of 25 positions shown; findings below may reference images not displayed]

FINDINGS: Gallbladder:

A few small gallstones noted within the gallbladder, without
evidence of wall thickening. Negative sonographic Jethron.

Common bile duct:

Diameter: Normal caliber, 5 mm.

Liver:

No focal lesion identified. Within normal limits in parenchymal
echogenicity.

IVC:

No abnormality visualized.

Pancreas:

Visualized portion unremarkable.

Spleen:

Size and appearance within normal limits.

Right Kidney:

Length: 11.6 cm. Echogenicity within normal limits. No mass or
hydronephrosis visualized.

Left Kidney:

Length: 13.2 cm. Echogenicity within normal limits. No mass or
hydronephrosis visualized.

Abdominal aorta:

No aneurysm visualized.

Other findings:

None.
IMPRESSION: No acute abnormality.

Cholelithiasis.  No sonographic evidence of acute cholecystitis.

## 2015-11-30 IMAGING — US US TRANSVAGINAL NON-OB
2 series · 13 of 25 positions shown · non-contrast
Comparison: CT Abdomen and Pelvis 05/08/2014. Abdomen ultrasound
from today reported separately.

CLINICAL DATA: 42-year-old female with right flank pain. History of
tubal ligation. Premenopausal, LMP 1 week ago. Initial encounter.

EXAM:
TRANSABDOMINAL AND TRANSVAGINAL ULTRASOUND OF PELVIS
DOPPLER ULTRASOUND OF OVARIES
TECHNIQUE: Both transabdominal and transvaginal ultrasound examinations of the
pelvis were performed. Transabdominal technique was performed for
global imaging of the pelvis including uterus, ovaries, adnexal
regions, and pelvic cul-de-sac.
It was necessary to proceed with endovaginal exam following the
transabdominal exam to visualize the ovaries. Color and duplex
Doppler ultrasound was utilized to evaluate blood flow to the
ovaries.

[Series 1: us transvaginal non-ob · 0.21mm/px · 7 of 49 slices shown (1 of 2)]
[im 1/49]
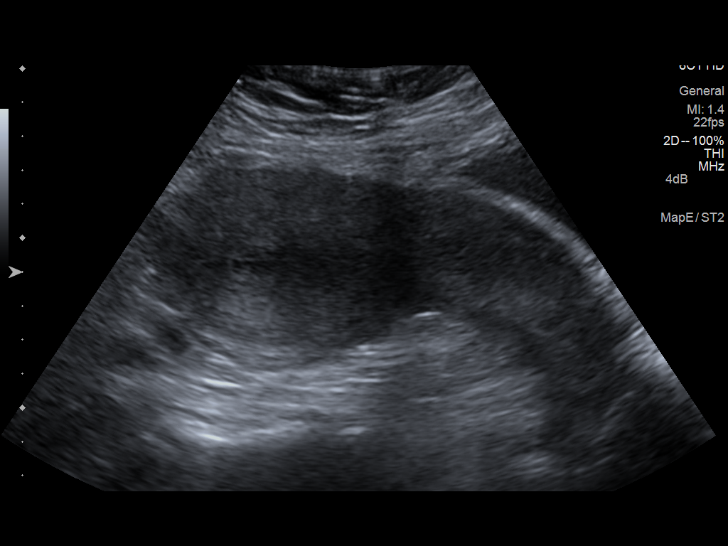
[im 9/49]
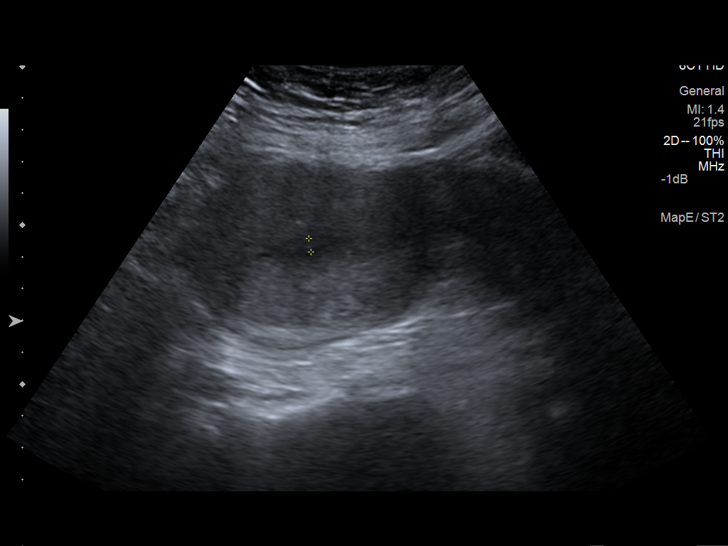
[im 17/49]
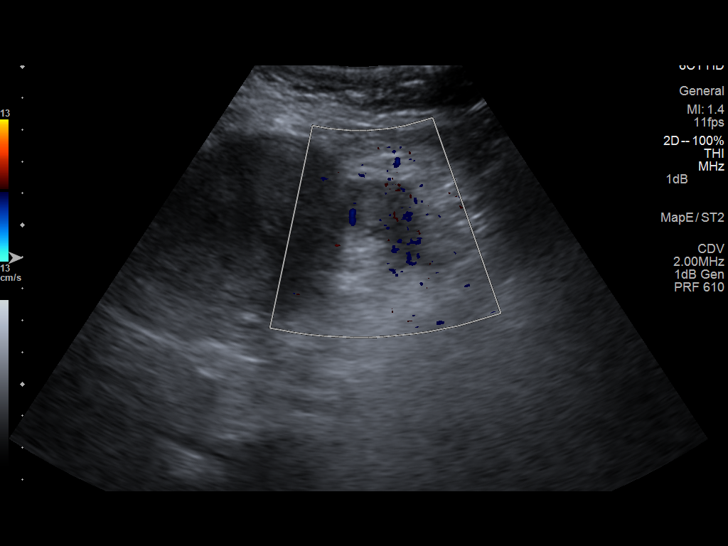
[im 25/49]
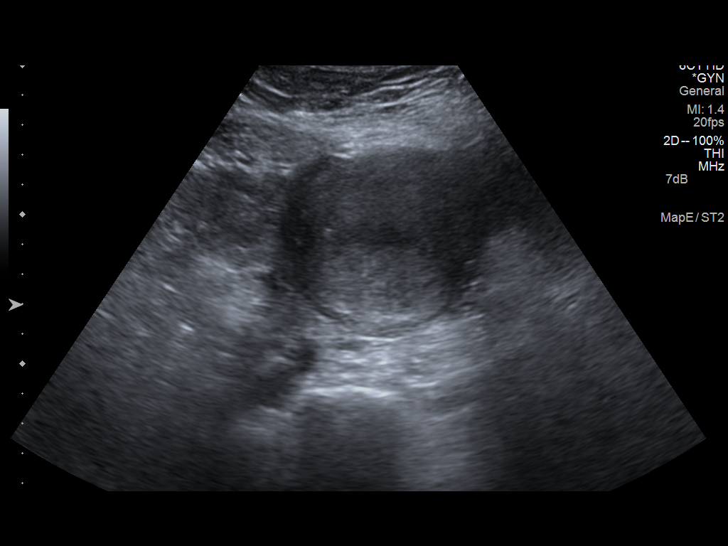
[im 33/49]
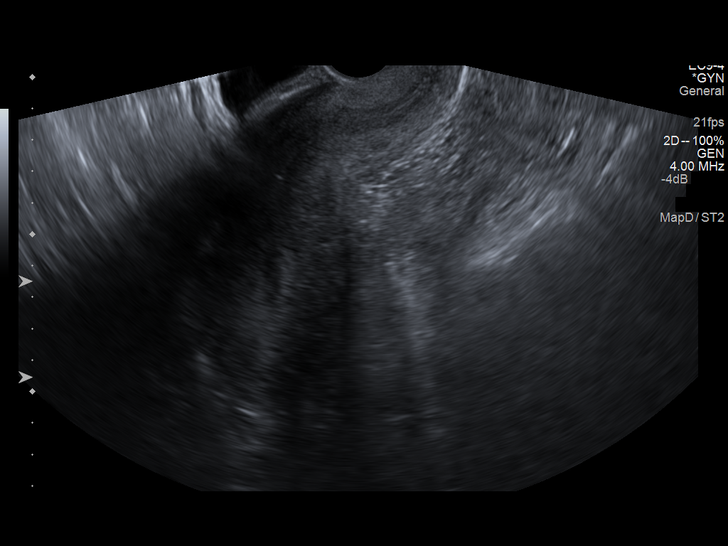
[im 41/49]
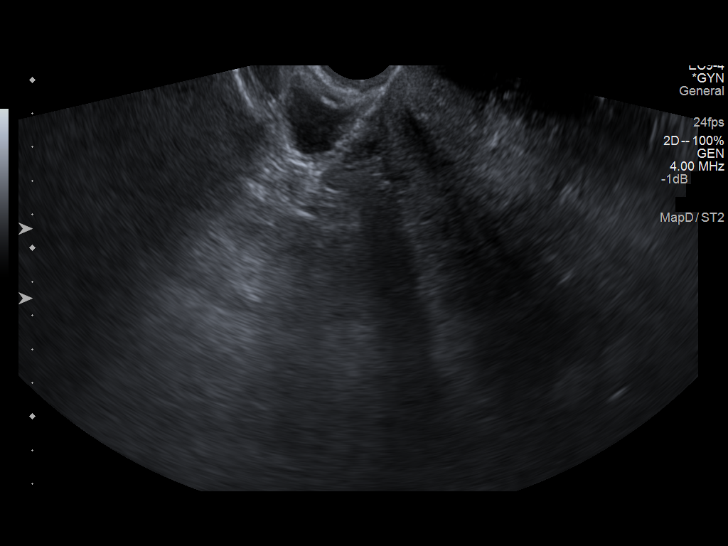
[im 49/49]
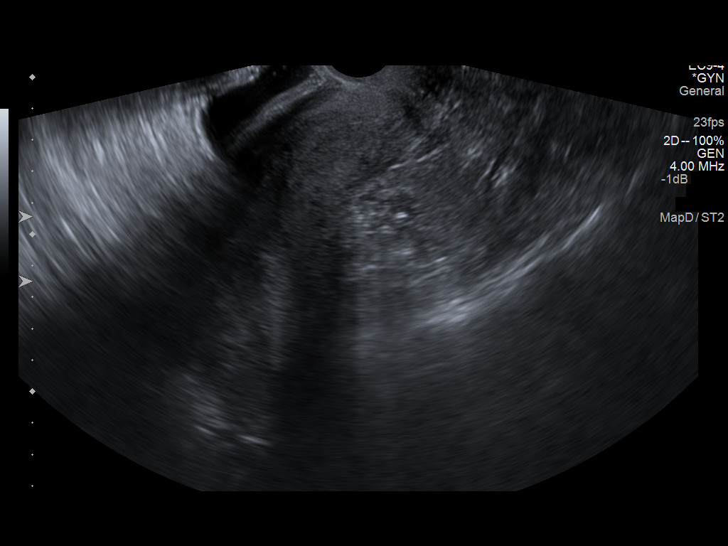

[Series 1: us transvaginal non-ob · 0.22mm/px · 6 of 49 slices shown (2 of 2)]
[im 5/49]
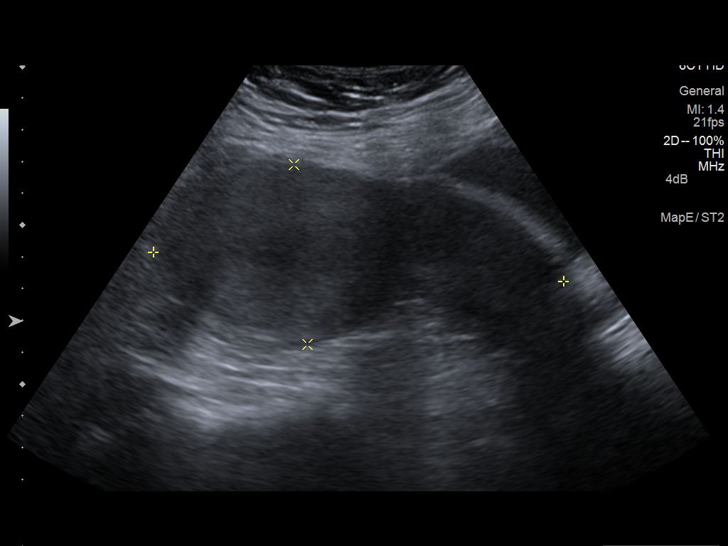
[im 14/49]
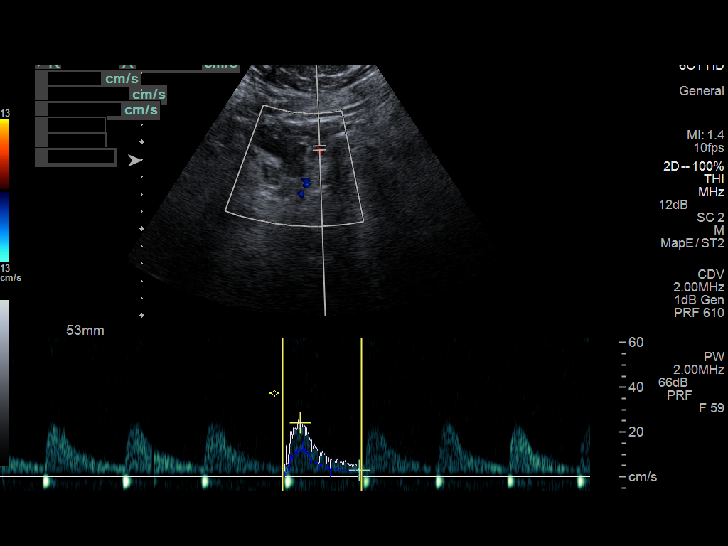
[im 22/49]
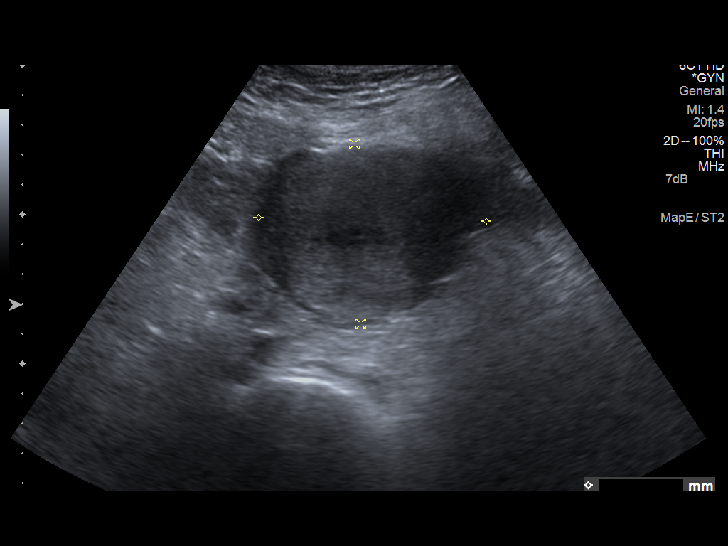
[im 31/49]
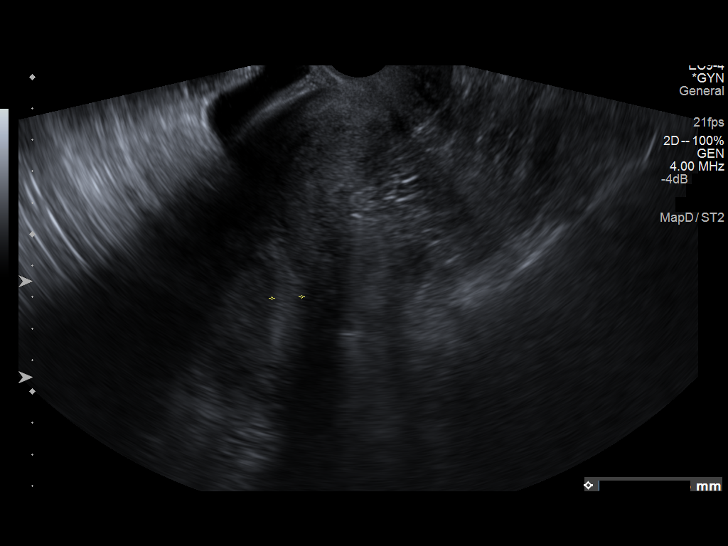
[im 40/49]
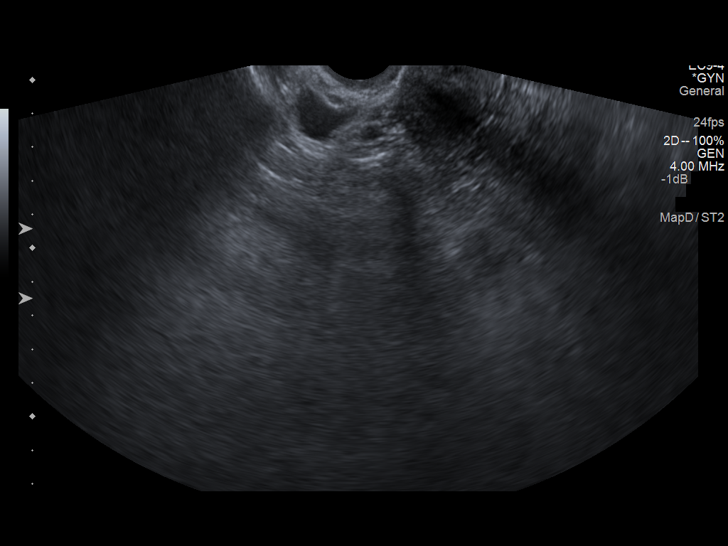
[im 49/49]
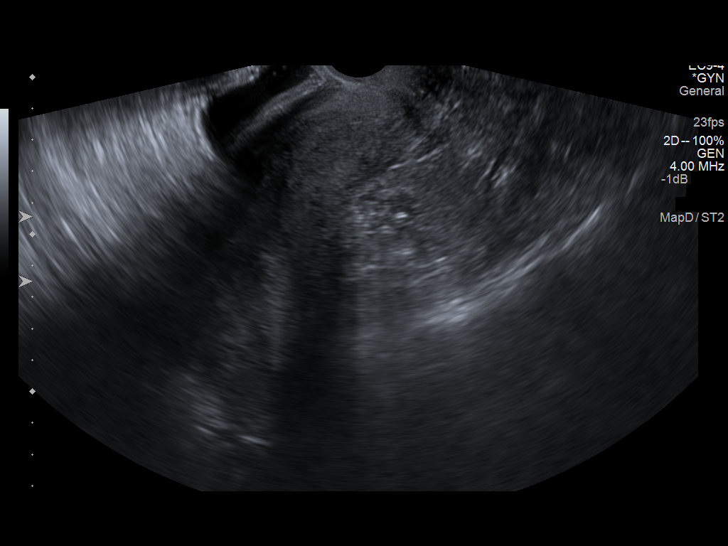

[13 of 25 positions shown; findings below may reference images not displayed]

FINDINGS: Uterus

Measurements: 12.9 x 5.7 x 7.6 cm. Anteverted, difficult to
visualize with transvaginal technique. No fibroids or other mass
visualized.

Endometrium

Thickness: 4 mm.  No focal abnormality visualized.

Right ovary

Measurements: 4.6 x 2.3 x 2.9 cm. Normal appearance/no adnexal mass.

Left ovary

Measurements: 3.6 x 1.9 x 3.7 cm. Normal appearance/no adnexal mass.

Pulsed Doppler evaluation of both ovaries demonstrates normal
low-resistance arterial and venous waveforms.

Other findings

No free fluid.
IMPRESSION: Negative; normal ovaries without evidence of torsion.

## 2018-06-06 ENCOUNTER — Encounter (HOSPITAL_COMMUNITY): Payer: Self-pay | Admitting: Emergency Medicine

## 2018-06-06 ENCOUNTER — Other Ambulatory Visit: Payer: Self-pay

## 2018-06-06 ENCOUNTER — Ambulatory Visit (HOSPITAL_COMMUNITY)
Admission: EM | Admit: 2018-06-06 | Discharge: 2018-06-06 | Disposition: A | Discharge status: Home / Self Care | Payer: Medicaid Other | Attending: Radiology | Admitting: Radiology

## 2018-06-06 DIAGNOSIS — L03119 Cellulitis of unspecified part of limb: Secondary | ICD-10-CM | POA: Diagnosis not present

## 2018-06-06 HISTORY — DX: Malignant neoplasm of colon, unspecified: C18.9

## 2018-06-06 HISTORY — DX: Malignant (primary) neoplasm, unspecified: C80.1

## 2018-06-06 HISTORY — DX: Pneumonia, unspecified organism: J18.9

## 2018-06-06 MED ORDER — CEPHALEXIN 500 MG PO CAPS: 500.0000 mg | ORAL_CAPSULE | Freq: Four times a day (QID) | ORAL | 0 refills | Status: DC

## 2018-06-06 NOTE — ED Provider Notes (Signed)
[place]    CSN: [id number] Arrival date & time: 06/06/18  1151     History   Chief Complaint Chief Complaint  Patient presents with  . Leg Pain    HPI Martha Wood is a 47 y.o. female.   47 year old female presents with mild erythema to bilateral legs worse on left than right x1 day.  Patient reports a history of colon cancer 28 weeks treatment that has left her lymphedema.  Patient states she is visiting from out of town and received prophylactic treatment when her legs begin to show signs of erythema.  Patient is requesting a 7-day treatment of Keflex at this time.  Patient states that she has been unable to use her compression stockings as worsens her skin condition and is not able to take Lasix.     Past Medical History:  Diagnosis Date  . Cancer ([place])   . Colon cancer ([place])   . Obesity   . Pneumonia     There are no active problems to display for this patient.   Past Surgical History:  Procedure Laterality Date  . ABDOMINAL HYSTERECTOMY    . CESAREAN SECTION    . COLON SURGERY    . HERNIA REPAIR      OB History   None      Home Medications    Prior to Admission medications   Medication Sig Start Date End Date Taking? Authorizing Provider  oxyCODONE-acetaminophen (ROXICET) 5-325 MG per tablet Take 1 tablet by mouth every 6 (six) hours as needed for severe pain. 05/31/14   Varney Biles, MD    Family History Family History  Problem Relation Age of Onset  . Hypertension Mother   . Healthy Father     Social History Social History   Tobacco Use  . Smoking status: Current Every Day Smoker    Packs/day: 0.50    Types: Cigarettes  Substance Use Topics  . Alcohol use: No  . Drug use: Yes    Types: Marijuana     Allergies   Diphenhydramine; Codeine; Morphine and related; and Latex   Review of Systems Review of Systems  Constitutional: Negative for chills and fever.  HENT: Negative for ear pain and sore throat.   Eyes:  Negative for pain and visual disturbance.  Respiratory: Negative for cough and shortness of breath.   Cardiovascular: Negative for chest pain and palpitations.  Gastrointestinal: Negative for abdominal pain and vomiting.  Genitourinary: Negative for dysuria and hematuria.  Musculoskeletal: Negative for arthralgias and back pain.  Skin: Positive for color change ( redish). Negative for rash.       Lower extremity edema  Neurological: Negative for seizures and syncope.  All other systems reviewed and are negative.    Physical Exam Triage Vital Signs ED Triage Vitals  Enc Vitals Group     BP 06/06/18 1338 119/70     Pulse Rate 06/06/18 1338 63     Resp 06/06/18 1338 (!) 22     Temp 06/06/18 1338 97.9 F (36.6 C)     Temp Source 06/06/18 1338 Oral     SpO2 06/06/18 1338 99 %     Weight --      Height --      Head Circumference --      Peak Flow --      Pain Score 06/06/18 1333 8     Pain Loc --      Pain Edu? --      Excl. in  GC? --    No data found.  Updated Vital Signs BP 119/70 (BP Location: Right Arm) Comment (BP Location): regular cuff, right forearm  Pulse 63   Temp 97.9 F (36.6 C) (Oral)   Resp (!) 22   LMP 05/30/2014   SpO2 99%   Visual Acuity Right Eye Distance:   Left Eye Distance:   Bilateral Distance:    Right Eye Near:   Left Eye Near:    Bilateral Near:     Physical Exam  Constitutional: She is oriented to person, place, and time. She appears well-developed and well-nourished.  HENT:  Head: Normocephalic and atraumatic.  Eyes: Conjunctivae are normal.  Neck: Normal range of motion.  Pulmonary/Chest: Effort normal.  Neurological: She is alert and oriented to person, place, and time.  Skin: There is erythema ( mild erythema noted bilateral lower legs slightly worse on left than right,. Putting edema 2+).  Psychiatric: She has a normal mood and affect.  Nursing note and vitals reviewed.    UC Treatments / Results  Labs (all labs ordered are  listed, but only abnormal results are displayed) Labs Reviewed - No data to display  EKG None  Radiology No results found.  Procedures Procedures (including critical care time)  Medications Ordered in UC Medications - No data to display  Initial Impression / Assessment and Plan / UC Course  I have reviewed the triage vital signs and the nursing notes.  Pertinent labs & imaging results that were available during my care of the patient were reviewed by me and considered in my medical decision making (see chart for details).      Final Clinical Impressions(s) / UC Diagnoses   Final diagnoses:  None   Discharge Instructions   None    ED Prescriptions    None     Controlled Substance Prescriptions [place] Controlled Substance Registry consulted? Not Applicable   Jacqualine Mau, NP 06/06/18 1359

## 2018-06-06 NOTE — Discharge Instructions (Addendum)
Continue to elevate legs and use compression stockings

## 2018-06-06 NOTE — ED Triage Notes (Signed)
Patient is visiting the area.  Patient says she has had cancer and lymphedema.    Patient says swelling in legs is worse than usual.  Swelling in feet and legs, normally just legs. Patient says she has had infection in her legs before and concerned that is what is going on now.  Has stinging and pain both legs.  No open wound present.    Patient is leaving tomorrow for home

## 2019-05-03 ENCOUNTER — Encounter (HOSPITAL_COMMUNITY): Payer: Self-pay | Admitting: Emergency Medicine

## 2019-05-03 ENCOUNTER — Inpatient Hospital Stay (HOSPITAL_COMMUNITY)
Admission: EM | Admit: 2019-05-03 | Discharge: 2019-05-06 | DRG: 603 | Disposition: A | Discharge status: Home / Self Care | Payer: Medicaid Other | Attending: Internal Medicine | Admitting: Internal Medicine

## 2019-05-03 ENCOUNTER — Other Ambulatory Visit: Payer: Self-pay

## 2019-05-03 ENCOUNTER — Emergency Department (HOSPITAL_COMMUNITY): Payer: Medicaid Other

## 2019-05-03 DIAGNOSIS — Z85048 Personal history of other malignant neoplasm of rectum, rectosigmoid junction, and anus: Secondary | ICD-10-CM

## 2019-05-03 DIAGNOSIS — Z87891 Personal history of nicotine dependence: Secondary | ICD-10-CM | POA: Diagnosis not present

## 2019-05-03 DIAGNOSIS — F419 Anxiety disorder, unspecified: Secondary | ICD-10-CM | POA: Diagnosis present

## 2019-05-03 DIAGNOSIS — L03119 Cellulitis of unspecified part of limb: Secondary | ICD-10-CM | POA: Diagnosis not present

## 2019-05-03 DIAGNOSIS — Z85038 Personal history of other malignant neoplasm of large intestine: Secondary | ICD-10-CM | POA: Diagnosis not present

## 2019-05-03 DIAGNOSIS — Z923 Personal history of irradiation: Secondary | ICD-10-CM

## 2019-05-03 DIAGNOSIS — I10 Essential (primary) hypertension: Secondary | ICD-10-CM | POA: Diagnosis present

## 2019-05-03 DIAGNOSIS — Z20828 Contact with and (suspected) exposure to other viral communicable diseases: Secondary | ICD-10-CM | POA: Diagnosis present

## 2019-05-03 DIAGNOSIS — E872 Acidosis, unspecified: Secondary | ICD-10-CM | POA: Diagnosis present

## 2019-05-03 DIAGNOSIS — L03115 Cellulitis of right lower limb: Principal | ICD-10-CM | POA: Diagnosis present

## 2019-05-03 DIAGNOSIS — L03116 Cellulitis of left lower limb: Secondary | ICD-10-CM | POA: Diagnosis present

## 2019-05-03 DIAGNOSIS — E119 Type 2 diabetes mellitus without complications: Secondary | ICD-10-CM

## 2019-05-03 DIAGNOSIS — F329 Major depressive disorder, single episode, unspecified: Secondary | ICD-10-CM | POA: Diagnosis present

## 2019-05-03 DIAGNOSIS — D649 Anemia, unspecified: Secondary | ICD-10-CM | POA: Diagnosis present

## 2019-05-03 DIAGNOSIS — I890 Lymphedema, not elsewhere classified: Secondary | ICD-10-CM | POA: Diagnosis present

## 2019-05-03 DIAGNOSIS — Z79899 Other long term (current) drug therapy: Secondary | ICD-10-CM | POA: Diagnosis not present

## 2019-05-03 DIAGNOSIS — R8271 Bacteriuria: Secondary | ICD-10-CM | POA: Diagnosis present

## 2019-05-03 DIAGNOSIS — E1165 Type 2 diabetes mellitus with hyperglycemia: Secondary | ICD-10-CM | POA: Diagnosis present

## 2019-05-03 DIAGNOSIS — Z90710 Acquired absence of both cervix and uterus: Secondary | ICD-10-CM

## 2019-05-03 DIAGNOSIS — Z933 Colostomy status: Secondary | ICD-10-CM

## 2019-05-03 DIAGNOSIS — R739 Hyperglycemia, unspecified: Secondary | ICD-10-CM | POA: Diagnosis not present

## 2019-05-03 DIAGNOSIS — Z6841 Body Mass Index (BMI) 40.0 and over, adult: Secondary | ICD-10-CM

## 2019-05-03 DIAGNOSIS — F32A Depression, unspecified: Secondary | ICD-10-CM | POA: Diagnosis present

## 2019-05-03 DIAGNOSIS — R5381 Other malaise: Secondary | ICD-10-CM | POA: Diagnosis present

## 2019-05-03 HISTORY — DX: Lymphedema, not elsewhere classified: I89.0

## 2019-05-03 LAB — CBC WITH DIFFERENTIAL/PLATELET
Abs Immature Granulocytes: 0.12 10*3/uL — ABNORMAL HIGH (ref 0.00–0.07)
Basophils Absolute: 0.1 10*3/uL (ref 0.0–0.1)
Basophils Relative: 1 %
Eosinophils Absolute: 0.3 10*3/uL (ref 0.0–0.5)
Eosinophils Relative: 4 %
HCT: 39.4 % (ref 36.0–46.0)
Hemoglobin: 12 g/dL (ref 12.0–15.0)
Immature Granulocytes: 2 %
Lymphocytes Relative: 14 %
Lymphs Abs: 0.9 10*3/uL (ref 0.7–4.0)
MCH: 28 pg (ref 26.0–34.0)
MCHC: 30.5 g/dL (ref 30.0–36.0)
MCV: 91.8 fL (ref 80.0–100.0)
Monocytes Absolute: 0.7 10*3/uL (ref 0.1–1.0)
Monocytes Relative: 10 %
Neutro Abs: 4.5 10*3/uL (ref 1.7–7.7)
Neutrophils Relative %: 69 %
Platelets: 251 10*3/uL (ref 150–400)
RBC: 4.29 MIL/uL (ref 3.87–5.11)
RDW: 18.9 % — ABNORMAL HIGH (ref 11.5–15.5)
WBC: 6.5 10*3/uL (ref 4.0–10.5)
nRBC: 0 % (ref 0.0–0.2)

## 2019-05-03 LAB — CBG MONITORING, ED: Glucose-Capillary: 179 mg/dL — ABNORMAL HIGH (ref 70–99)

## 2019-05-03 LAB — SEDIMENTATION RATE: Sed Rate: 34 mm/hr — ABNORMAL HIGH (ref 0–22)

## 2019-05-03 LAB — BASIC METABOLIC PANEL
Anion gap: 9 (ref 5–15)
BUN: 21 mg/dL — ABNORMAL HIGH (ref 6–20)
CO2: 23 mmol/L (ref 22–32)
Calcium: 9.1 mg/dL (ref 8.9–10.3)
Chloride: 107 mmol/L (ref 98–111)
Creatinine, Ser: 0.83 mg/dL (ref 0.44–1.00)
GFR calc Af Amer: >60 mL/min (ref >60)
GFR calc non Af Amer: >60 mL/min (ref >60)
Glucose, Bld: 187 mg/dL — ABNORMAL HIGH (ref 70–99)
Potassium: 3.7 mmol/L (ref 3.5–5.1)
Sodium: 139 mmol/L (ref 135–145)

## 2019-05-03 LAB — URINALYSIS, ROUTINE W REFLEX MICROSCOPIC
Bilirubin Urine: NEGATIVE
Glucose, UA: 50 mg/dL — AB
Ketones, ur: NEGATIVE mg/dL
Nitrite: NEGATIVE
Protein, ur: NEGATIVE mg/dL
Specific Gravity, Urine: 1.012 (ref 1.005–1.030)
pH: 6 (ref 5.0–8.0)

## 2019-05-03 LAB — HEPATIC FUNCTION PANEL
ALT: 72 U/L — ABNORMAL HIGH (ref 0–44)
AST: 46 U/L — ABNORMAL HIGH (ref 15–41)
Albumin: 3 g/dL — ABNORMAL LOW (ref 3.5–5.0)
Alkaline Phosphatase: 102 U/L (ref 38–126)
Bilirubin, Direct: 0.2 mg/dL (ref 0.0–0.2)
Indirect Bilirubin: 0.2 mg/dL — ABNORMAL LOW (ref 0.3–0.9)
Total Bilirubin: 0.4 mg/dL (ref 0.3–1.2)
Total Protein: 6.7 g/dL (ref 6.5–8.1)

## 2019-05-03 LAB — TSH: TSH: 3.997 u[IU]/mL (ref 0.350–4.500)

## 2019-05-03 LAB — PROTIME-INR
INR: 1 (ref 0.8–1.2)
Prothrombin Time: 13 seconds (ref 11.4–15.2)

## 2019-05-03 LAB — TROPONIN I (HIGH SENSITIVITY)
Troponin I (High Sensitivity): 6 ng/L (ref <18)
Troponin I (High Sensitivity): 8 ng/L (ref <18)

## 2019-05-03 LAB — LACTIC ACID, PLASMA
Lactic Acid, Venous: 2.1 mmol/L (ref 0.5–1.9)
Lactic Acid, Venous: 1.6 mmol/L (ref 0.5–1.9)

## 2019-05-03 LAB — MAGNESIUM: Magnesium: 2.1 mg/dL (ref 1.7–2.4)

## 2019-05-03 LAB — I-STAT BETA HCG BLOOD, ED (MC, WL, AP ONLY): I-stat hCG, quantitative: <5 m[IU]/mL (ref <5)

## 2019-05-03 LAB — C-REACTIVE PROTEIN: CRP: 1.1 mg/dL — ABNORMAL HIGH (ref <1.0)

## 2019-05-03 LAB — SARS CORONAVIRUS 2 BY RT PCR (HOSPITAL ORDER, PERFORMED IN ~~LOC~~ HOSPITAL LAB): SARS Coronavirus 2: NEGATIVE

## 2019-05-03 LAB — BRAIN NATRIURETIC PEPTIDE: B Natriuretic Peptide: 33.7 pg/mL (ref 0.0–100.0)

## 2019-05-03 LAB — PHOSPHORUS: Phosphorus: 3.5 mg/dL (ref 2.5–4.6)

## 2019-05-03 MED ORDER — IRBESARTAN 300 MG PO TABS
300.0000 mg | ORAL_TABLET | Freq: Every day | ORAL | Status: DC
  Administered 2019-05-03 – 2019-05-06 (×4): 300 mg via ORAL
  Filled 2019-05-03 (×4): qty 1

## 2019-05-03 MED ORDER — LIRAGLUTIDE 18 MG/3ML ~~LOC~~ SOPN: 1.8000 mg | PEN_INJECTOR | Freq: Every day | SUBCUTANEOUS | Status: DC

## 2019-05-03 MED ORDER — ONDANSETRON HCL 4 MG PO TABS: 4.0000 mg | ORAL_TABLET | Freq: Four times a day (QID) | ORAL | Status: DC | PRN

## 2019-05-03 MED ORDER — ACETAMINOPHEN 325 MG PO TABS
650.0000 mg | ORAL_TABLET | Freq: Four times a day (QID) | ORAL | Status: DC | PRN
  Administered 2019-05-03 – 2019-05-05 (×4): 650 mg via ORAL
  Filled 2019-05-03 (×4): qty 2

## 2019-05-03 MED ORDER — VANCOMYCIN HCL 10 G IV SOLR
1500.0000 mg | Freq: Two times a day (BID) | INTRAVENOUS | Status: DC
  Administered 2019-05-04 – 2019-05-05 (×3): 1500 mg via INTRAVENOUS
  Filled 2019-05-03 (×5): qty 1500

## 2019-05-03 MED ORDER — SODIUM CHLORIDE 0.9 % IV SOLN
1.0000 g | Freq: Once | INTRAVENOUS | Status: AC
  Administered 2019-05-03: 1 g via INTRAVENOUS
  Filled 2019-05-03: qty 10

## 2019-05-03 MED ORDER — METRONIDAZOLE 500 MG PO TABS
500.0000 mg | ORAL_TABLET | Freq: Three times a day (TID) | ORAL | Status: DC
  Administered 2019-05-03: 14:00:00 500 mg via ORAL
  Filled 2019-05-03: qty 1

## 2019-05-03 MED ORDER — POTASSIUM CHLORIDE CRYS ER 10 MEQ PO TBCR
10.0000 meq | EXTENDED_RELEASE_TABLET | Freq: Every day | ORAL | Status: DC
  Administered 2019-05-03 – 2019-05-06 (×4): 10 meq via ORAL
  Filled 2019-05-03 (×4): qty 1

## 2019-05-03 MED ORDER — SODIUM CHLORIDE 0.9 % IV BOLUS
500.0000 mL | Freq: Once | INTRAVENOUS | Status: AC
  Administered 2019-05-03: 500 mL via INTRAVENOUS

## 2019-05-03 MED ORDER — TOPIRAMATE 25 MG PO TABS
25.0000 mg | ORAL_TABLET | Freq: Two times a day (BID) | ORAL | Status: DC
  Administered 2019-05-03 – 2019-05-06 (×7): 25 mg via ORAL
  Filled 2019-05-03 (×7): qty 1

## 2019-05-03 MED ORDER — ALBUTEROL SULFATE (2.5 MG/3ML) 0.083% IN NEBU: 2.5000 mg | INHALATION_SOLUTION | Freq: Four times a day (QID) | RESPIRATORY_TRACT | Status: DC | PRN

## 2019-05-03 MED ORDER — ACETAMINOPHEN 650 MG RE SUPP: 650.0000 mg | Freq: Four times a day (QID) | RECTAL | Status: DC | PRN

## 2019-05-03 MED ORDER — ONDANSETRON HCL 4 MG/2ML IJ SOLN: 4.0000 mg | Freq: Four times a day (QID) | INTRAMUSCULAR | Status: DC | PRN

## 2019-05-03 MED ORDER — METOPROLOL SUCCINATE ER 25 MG PO TB24
25.0000 mg | ORAL_TABLET | Freq: Every day | ORAL | Status: DC
  Administered 2019-05-03 – 2019-05-06 (×4): 25 mg via ORAL
  Filled 2019-05-03 (×4): qty 1

## 2019-05-03 MED ORDER — SODIUM CHLORIDE 0.9 % IV SOLN
2.0000 g | INTRAVENOUS | Status: DC
  Administered 2019-05-04 – 2019-05-05 (×2): 2 g via INTRAVENOUS
  Filled 2019-05-03 (×2): qty 20

## 2019-05-03 MED ORDER — SODIUM CHLORIDE 0.9% FLUSH
3.0000 mL | Freq: Two times a day (BID) | INTRAVENOUS | Status: DC
  Administered 2019-05-05 – 2019-05-06 (×2): 3 mL via INTRAVENOUS

## 2019-05-03 MED ORDER — DULOXETINE HCL 30 MG PO CPEP
30.0000 mg | ORAL_CAPSULE | Freq: Every day | ORAL | Status: DC
  Administered 2019-05-03 – 2019-05-06 (×4): 30 mg via ORAL
  Filled 2019-05-03 (×4): qty 1

## 2019-05-03 MED ORDER — VANCOMYCIN HCL IN DEXTROSE 1-5 GM/200ML-% IV SOLN
1000.0000 mg | Freq: Once | INTRAVENOUS | Status: AC
  Administered 2019-05-03: 1000 mg via INTRAVENOUS
  Filled 2019-05-03: qty 200

## 2019-05-03 MED ORDER — ARIPIPRAZOLE 5 MG PO TABS
5.0000 mg | ORAL_TABLET | Freq: Every day | ORAL | Status: DC
  Administered 2019-05-03 – 2019-05-06 (×4): 5 mg via ORAL
  Filled 2019-05-03 (×4): qty 1

## 2019-05-03 MED ORDER — ENOXAPARIN SODIUM 80 MG/0.8ML ~~LOC~~ SOLN
80.0000 mg | SUBCUTANEOUS | Status: DC
  Administered 2019-05-03 – 2019-05-06 (×4): 80 mg via SUBCUTANEOUS
  Filled 2019-05-03 (×4): qty 0.8

## 2019-05-03 MED ORDER — CHLORTHALIDONE 25 MG PO TABS
25.0000 mg | ORAL_TABLET | Freq: Every day | ORAL | Status: DC
  Administered 2019-05-03 – 2019-05-06 (×4): 25 mg via ORAL
  Filled 2019-05-03 (×4): qty 1

## 2019-05-03 NOTE — ED Notes (Signed)
X-ray at bedside

## 2019-05-03 NOTE — ED Triage Notes (Signed)
Per [facility] pt coming from home c/o generalized weakness onset of Saturday along with increased thirst and urination. Patient also reports right arm tingling onset of 11pm last night. Patient has decreased sensation to right arm, equal grip strength.

## 2019-05-03 NOTE — Progress Notes (Signed)
Called Ortho tech concerning unna boots she stated that they didn't have any available and will notify nursing when available.

## 2019-05-03 NOTE — ED Notes (Signed)
ED TO INPATIENT HANDOFF REPORT  ED Nurse Name and Phone #: [phone]  S Name/Age/Gender Martha Wood 48 y.o. female Room/Bed: 034C/034C  Code Status   Code Status: Full Code  Home/SNF/Other Home Patient oriented to: self, place, time and situation Is this baseline? Yes   Triage Complete: Triage complete  Chief Complaint gen weakness  Triage Note Per NLZJQ pt coming from home c/o generalized weakness onset of Saturday along with increased thirst and urination. Patient also reports right arm tingling onset of 11pm last night. Patient has decreased sensation to right arm, equal grip strength.    Allergies Allergies  Allergen Reactions  . Diphenhydramine Anaphylaxis and Hives  . Codeine Itching and Nausea And Vomiting  . Morphine And Related Itching and Nausea And Vomiting  . Latex Hives and Other (See Comments)    Powdered gloves    Level of Care/Admitting Diagnosis ED Disposition    ED Disposition Condition Comment   Admit  Hospital Area: [facility] [[id number]]  Level of Care: Med-Surg [16]  I expect the patient will be discharged within 24 hours: No (not a candidate for 5C-Observation unit)  Covid Evaluation: Asymptomatic Screening Protocol (No Symptoms)  Diagnosis: Cellulitis of leg [734193]  Admitting Physician: Norval Morton [[id number]]  Attending Physician: Norval Morton [[id number]]  PT Class (Do Not Modify): Observation [104]  PT Acc Code (Do Not Modify): Observation [10022]       B Medical/Surgery History Past Medical History:  Diagnosis Date  . Cancer ([place])   . Colon cancer ([place])   . Lymphedema   . Obesity   . Pneumonia    Past Surgical History:  Procedure Laterality Date  . ABDOMINAL HYSTERECTOMY    . CESAREAN SECTION    . COLON SURGERY    . HERNIA REPAIR       A IV Location/Drains/Wounds Patient Lines/Drains/Airways Status   Active Line/Drains/Airways    Name:   Placement date:   Placement time:   Site:   Days:   Peripheral IV 05/03/19 Left Hand   05/03/19    1120    Hand   less than 1          Intake/Output Last 24 hours  Intake/Output Summary (Last 24 hours) at 05/03/2019 1509 Last data filed at 05/03/2019 1409 Gross per 24 hour  Intake 800 ml  Output -  Net 800 ml    Labs/Imaging Results for orders placed or performed during the hospital encounter of 05/03/19 (from the past 48 hour(s))  CBG monitoring, ED     Status: Abnormal   Collection Time: 05/03/19  8:56 AM  Result Value Ref Range   Glucose-Capillary 179 (H) 70 - 99 mg/dL   Comment 1 Notify RN    Comment 2 Document in Chart   Urinalysis, Routine w reflex microscopic     Status: Abnormal   Collection Time: 05/03/19  9:37 AM  Result Value Ref Range   Color, Urine YELLOW YELLOW   APPearance CLEAR CLEAR   Specific Gravity, Urine 1.012 1.005 - 1.030   pH 6.0 5.0 - 8.0   Glucose, UA 50 (A) NEGATIVE mg/dL   Hgb urine dipstick MODERATE (A) NEGATIVE   Bilirubin Urine NEGATIVE NEGATIVE   Ketones, ur NEGATIVE NEGATIVE mg/dL   Protein, ur NEGATIVE NEGATIVE mg/dL   Nitrite NEGATIVE NEGATIVE   Leukocytes,Ua SMALL (A) NEGATIVE   RBC / HPF 11-20 0 - 5 RBC/hpf   WBC, UA 11-20 0 - 5 WBC/hpf   Bacteria, UA  RARE (A) NONE SEEN   Squamous Epithelial / LPF 0-5 0 - 5    Comment: Performed at [facility], [place] [address] [place], [place] [address]  Basic metabolic panel     Status: Abnormal   Collection Time: 05/03/19  9:51 AM  Result Value Ref Range   Sodium 139 135 - 145 mmol/L   Potassium 3.7 3.5 - 5.1 mmol/L   Chloride 107 98 - 111 mmol/L   CO2 23 22 - 32 mmol/L   Glucose, Bld 187 (H) 70 - 99 mg/dL   BUN 21 (H) 6 - 20 mg/dL   Creatinine, Ser 0.83 0.44 - 1.00 mg/dL   Calcium 9.1 8.9 - 10.3 mg/dL   GFR calc non Af Amer >60 >60 mL/min   GFR calc Af Amer >60 >60 mL/min   Anion gap 9 5 - 15    Comment: Performed at [facility], [place] [address], [place], [place] [address]  Brain natriuretic peptide     Status: None    Collection Time: 05/03/19  9:51 AM  Result Value Ref Range   B Natriuretic Peptide 33.7 0.0 - 100.0 pg/mL    Comment: Performed at [facility] [address] [place], [place] [address]  Troponin I (High Sensitivity)     Status: None   Collection Time: 05/03/19 10:21 AM  Result Value Ref Range   Troponin I (High Sensitivity) 6 <18 ng/L    Comment: Performed at [facility] [address], [place], [place] [address]  Lactic acid, plasma     Status: Abnormal   Collection Time: 05/03/19 10:21 AM  Result Value Ref Range   Lactic Acid, Venous 2.1 (HH) 0.5 - 1.9 mmol/L    Comment: CRITICAL RESULT CALLED TO, READ BACK BY AND VERIFIED WITH: KATIE RAND,RN AT 1119 05/03/2019 BY ZBEECH. Performed at [facility], [place] [address], [place], [place] [address]   Protime-INR     Status: None   Collection Time: 05/03/19 10:21 AM  Result Value Ref Range   Prothrombin Time 13.0 11.4 - 15.2 seconds   INR 1.0 0.8 - 1.2    Comment: (NOTE) INR goal varies based on device and disease states. Performed at [facility], [place] [address] [place], [place] [address]   Magnesium     Status: None   Collection Time: 05/03/19 10:21 AM  Result Value Ref Range   Magnesium 2.1 1.7 - 2.4 mg/dL    Comment: Performed at [facility] [address], [place], [place] [address]  Phosphorus     Status: None   Collection Time: 05/03/19 10:21 AM  Result Value Ref Range   Phosphorus 3.5 2.5 - 4.6 mg/dL    Comment: Performed at [facility] [address], [place], [place] [address]  TSH     Status: None   Collection Time: 05/03/19 10:21 AM  Result Value Ref Range   TSH 3.997 0.350 - 4.500 uIU/mL    Comment: Performed by a 3rd Generation assay with a functional sensitivity of <=0.01 uIU/mL. Performed at [facility], [place] [address], [place], [place] [address]   Hepatic function panel     Status: Abnormal   Collection Time: 05/03/19 10:21 AM  Result Value Ref Range   Total  Protein 6.7 6.5 - 8.1 g/dL   Albumin 3.0 (L) 3.5 - 5.0 g/dL   AST 46 (H) 15 - 41 U/L   ALT  72 (H) 0 - 44 U/L   Alkaline Phosphatase 102 38 - 126 U/L   Total Bilirubin 0.4 0.3 - 1.2 mg/dL   Bilirubin, Direct 0.2 0.0 - 0.2 mg/dL   Indirect Bilirubin 0.2 (L) 0.3 - 0.9 mg/dL    Comment: Performed at [facility] [address], [place], [place] [address]  CBC with Differential     Status: Abnormal   Collection Time: 05/03/19 10:21 AM  Result Value Ref Range   WBC 6.5 4.0 - 10.5 K/uL   RBC 4.29 3.87 - 5.11 MIL/uL   Hemoglobin 12.0 12.0 - 15.0 g/dL   HCT 39.4 36.0 - 46.0 %   MCV 91.8 80.0 - 100.0 fL   MCH 28.0 26.0 - 34.0 pg   MCHC 30.5 30.0 - 36.0 g/dL   RDW 18.9 (H) 11.5 - 15.5 %   Platelets 251 150 - 400 K/uL   nRBC 0.0 0.0 - 0.2 %   Neutrophils Relative % 69 %   Neutro Abs 4.5 1.7 - 7.7 K/uL   Lymphocytes Relative 14 %   Lymphs Abs 0.9 0.7 - 4.0 K/uL   Monocytes Relative 10 %   Monocytes Absolute 0.7 0.1 - 1.0 K/uL   Eosinophils Relative 4 %   Eosinophils Absolute 0.3 0.0 - 0.5 K/uL   Basophils Relative 1 %   Basophils Absolute 0.1 0.0 - 0.1 K/uL   Immature Granulocytes 2 %   Abs Immature Granulocytes 0.12 (H) 0.00 - 0.07 K/uL    Comment: Performed at [facility] [address] [place], [place] [address]  I-Stat beta hCG blood, ED     Status: None   Collection Time: 05/03/19 10:23 AM  Result Value Ref Range   I-stat hCG, quantitative <5.0 <5 mIU/mL   Comment 3            Comment:   GEST. AGE      CONC.  (mIU/mL)   <=1 WEEK        5 - 50     2 WEEKS       50 - 500     3 WEEKS       100 - 10,000     4 WEEKS     1,000 - 30,000        FEMALE AND NON-PREGNANT FEMALE:     LESS THAN 5 mIU/mL   SARS Coronavirus 2 [facility] order, Performed in [facility] hospital lab) Nasopharyngeal Nasopharyngeal Swab     Status: None   Collection Time: 05/03/19 10:36 AM   Specimen: Nasopharyngeal Swab  Result Value Ref Range   SARS Coronavirus 2 NEGATIVE NEGATIVE     Comment: (NOTE) If result is NEGATIVE SARS-CoV-2 target nucleic acids are NOT DETECTED. The SARS-CoV-2 RNA is generally detectable in upper and lower  respiratory specimens during the acute phase of infection. The lowest  concentration of SARS-CoV-2 viral copies this assay can detect is 250  copies / mL. A negative result does not preclude SARS-CoV-2 infection  and should not be used as the sole basis for treatment or other  patient management decisions.  A negative result may occur with  improper specimen collection / handling, submission of specimen other  than nasopharyngeal swab, presence of viral mutation(s) within the  areas targeted by this assay, and inadequate number of viral copies  (<250 copies / mL). A negative result must be combined with clinical  observations, patient history, and epidemiological information. If result is POSITIVE SARS-CoV-2 target  nucleic acids are DETECTED. The SARS-CoV-2 RNA is generally detectable in upper and lower  respiratory specimens dur ing the acute phase of infection.  Positive  results are indicative of active infection with SARS-CoV-2.  Clinical  correlation with patient history and other diagnostic information is  necessary to determine patient infection status.  Positive results do  not rule out bacterial infection or co-infection with other viruses. If result is PRESUMPTIVE POSTIVE SARS-CoV-2 nucleic acids MAY BE PRESENT.   A presumptive positive result was obtained on the submitted specimen  and confirmed on repeat testing.  While 2019 novel coronavirus  (SARS-CoV-2) nucleic acids may be present in the submitted sample  additional confirmatory testing may be necessary for epidemiological  and / or clinical management purposes  to differentiate between  SARS-CoV-2 and other Sarbecovirus currently known to infect humans.  If clinically indicated additional testing with an alternate test  methodology [phone]) is advised. The SARS-CoV-2  RNA is generally  detectable in upper and lower respiratory sp ecimens during the acute  phase of infection. The expected result is Negative. Fact Sheet for Patients:  StrictlyIdeas.no Fact Sheet for Healthcare Providers: BankingDealers.co.za This test is not yet approved or cleared by the [place] FDA and has been authorized for detection and/or diagnosis of SARS-CoV-2 by FDA under an Emergency Use Authorization (EUA).  This EUA will remain in effect (meaning this test can be used) for the duration of the COVID-19 declaration under Section 564(b)(1) of the Act, 21 U.S.C. section 360bbb-3(b)(1), unless the authorization is terminated or revoked sooner. Performed at [facility], [place] [address], [place], [place] [address]   Lactic acid, plasma     Status: None   Collection Time: 05/03/19 12:10 PM  Result Value Ref Range   Lactic Acid, Venous 1.6 0.5 - 1.9 mmol/L    Comment: Performed at [facility] [address] [place], [place] [address]  Troponin I (High Sensitivity)     Status: None   Collection Time: 05/03/19 12:14 PM  Result Value Ref Range   Troponin I (High Sensitivity) 8 <18 ng/L    Comment: (NOTE) Elevated high sensitivity troponin I (hsTnI) values and significant  changes across serial measurements may suggest ACS but many other  chronic and acute conditions are known to elevate hsTnI results.  Refer to the "Links" section for chest pain algorithms and additional  guidance. Performed at [facility], [place] [address], [place], [place] [address]   C-reactive protein     Status: Abnormal   Collection Time: 05/03/19 12:14 PM  Result Value Ref Range   CRP 1.1 (H) <1.0 mg/dL    Comment: Performed at [facility] [address], [place], [place] [address]  Wound or Superficial Culture     Status: None (Preliminary result)   Collection Time: 05/03/19 12:22 PM   Specimen: Wound  Result Value Ref  Range   Specimen Description WOUND LEG SHIN    Special Requests NONE    Gram Stain      FEW SQUAMOUS EPITHELIAL CELLS PRESENT NO WBC SEEN FEW GRAM POSITIVE COCCI Performed at [facility], [place] [address], [place], [place] [address]    Culture PENDING    Report Status PENDING    Dg Chest Port 1 View  Result Date: 05/03/2019 CLINICAL DATA:  Weakness EXAM: PORTABLE CHEST 1 VIEW COMPARISON:  None. FINDINGS: Cardiac shadows within normal limits. The lungs are well aerated bilaterally. No focal infiltrate or  sizable effusion is seen. No bony abnormality is noted. IMPRESSION: No acute abnormality noted. Electronically Signed   By: Inez Catalina M.D.   On: 05/03/2019 11:58    Pending Labs Unresulted Labs (From admission, onward)    Start     Ordered   05/04/19 0500  Hemoglobin A1c  Tomorrow morning,   R     05/03/19 1310   05/04/19 0500  Prealbumin  Tomorrow morning,   R     05/03/19 1310   05/04/19 0500  CBC  Tomorrow morning,   R     05/03/19 1310   05/04/19 5397  Basic metabolic panel  Tomorrow morning,   R     05/03/19 1310   05/03/19 1308  Sedimentation rate  Add-on,   AD     05/03/19 1310   05/03/19 1303  HIV antibody (Routine Testing)  Once,   AD     05/03/19 1310   05/03/19 1146  Culture, blood (routine x 2)  BLOOD CULTURE X 2,   STAT     05/03/19 1147          Vitals/Pain Today's Vitals   05/03/19 1230 05/03/19 1302 05/03/19 1330 05/03/19 1400  BP: 129/76 136/80 (!) 153/96 (!) 144/86  Pulse: 79 80 73 78  Resp: 14 17 17 15   Temp:      TempSrc:      SpO2: 100% 100% 99% 99%  Weight:      Height:      PainSc:        Isolation Precautions No active isolations  Medications Medications  chlorthalidone (HYGROTON) tablet 25 mg (25 mg Oral Given 05/03/19 1405)  metoprolol succinate (TOPROL-XL) 24 hr tablet 25 mg (25 mg Oral Given 05/03/19 1405)  irbesartan (AVAPRO) tablet 300 mg (300 mg Oral Given 05/03/19 1405)  ARIPiprazole (ABILIFY) tablet 5 mg (5 mg Oral  Given 05/03/19 1407)  liraglutide (VICTOZA) SOPN 1.8 mg (has no administration in time range)  DULoxetine (CYMBALTA) DR capsule 30 mg (30 mg Oral Given 05/03/19 1407)  topiramate (TOPAMAX) tablet 25 mg (25 mg Oral Given 05/03/19 1405)  potassium chloride (K-DUR) CR tablet 10 mEq (10 mEq Oral Given 05/03/19 1405)  enoxaparin (LOVENOX) injection 80 mg (80 mg Subcutaneous Given 05/03/19 1411)  sodium chloride flush (NS) 0.9 % injection 3 mL (3 mLs Intravenous Not Given 05/03/19 1345)  acetaminophen (TYLENOL) tablet 650 mg (has no administration in time range)    Or  acetaminophen (TYLENOL) suppository 650 mg (has no administration in time range)  ondansetron (ZOFRAN) tablet 4 mg (has no administration in time range)    Or  ondansetron (ZOFRAN) injection 4 mg (has no administration in time range)  albuterol (PROVENTIL) (2.5 MG/3ML) 0.083% nebulizer solution 2.5 mg (has no administration in time range)  cefTRIAXone (ROCEPHIN) 2 g in sodium chloride 0.9 % 100 mL IVPB (has no administration in time range)  metroNIDAZOLE (FLAGYL) tablet 500 mg (500 mg Oral Given 05/03/19 1414)  vancomycin (VANCOCIN) IVPB 1000 mg/200 mL premix (has no administration in time range)  vancomycin (VANCOCIN) 1,500 mg in sodium chloride 0.9 % 500 mL IVPB (has no administration in time range)  cefTRIAXone (ROCEPHIN) 1 g in sodium chloride 0.9 % 100 mL IVPB (0 g Intravenous Stopped 05/03/19 1245)  vancomycin (VANCOCIN) IVPB 1000 mg/200 mL premix (0 mg Intravenous Stopped 05/03/19 1401)  sodium chloride 0.9 % bolus 500 mL (0 mLs Intravenous Stopped 05/03/19 1409)    Mobility walks with person assist High fall risk  R Recommendations: See Admitting Provider Note  Report given to:   Additional Notes:

## 2019-05-03 NOTE — H&P (Signed)
History and Physical    Martha Wood ASN:[id number] DOB: 1971/07/28 DOA: 05/03/2019   Referring MD/NP/PA: Tomasa Rand, MD PCP: Patient, No Pcp Per   Patient coming from: home  Chief Complaint: Not feeling well  I have personally briefly reviewed patient's old medical records in [place]   HPI: Martha Wood is a 48 y.o. 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  She complains of intermittent numbness in her bilateral legs, feet, and right arm.  Denies having any focal weakness.  She states that she has had worsening swelling in both legs, and they started to turn red in color.  A blister formed on the right shin and has been weeping clear fluid.  Denies any history of diabetes, and reports being on Victoza for help with weight loss.  She has a known history of lymphedema following her colon cancer treatment, but does not normally wrap her legs.  States that this is the most swollen they have ever been.  Associated symptoms include some mild shortness of breath, nausea, and generalized malaise.  Denies having any cough, fever, chest pain, vomiting, diarrhea, or dysuria.   ED Course: Upon admission into the emergency department patient was noted to be afebrile with vital signs relatively within normal limits.  Labs revealed WBC 6.5, BUN 21, creatinine 0.83, glucose 187, and lactic acid 2.1.  COVID-19 screening test was negative.  Chest x-ray showed no acute abnormalities.  Patient was started on empiric antibiotics of vancomycin and Rocephin.  TRH called to admit.    Review of Systems  Constitutional: Positive for malaise/fatigue. Negative for fever.  HENT: Negative for ear discharge and nosebleeds.   Eyes: Negative for  pain.  Respiratory: Positive for shortness of breath. Negative for cough.   Cardiovascular: Positive for leg swelling. Negative for chest pain.  Gastrointestinal: Positive for nausea. Negative for vomiting.  Genitourinary: Negative for dysuria and hematuria.  Musculoskeletal: Negative for falls.  Skin:       Positive for skin color change  Neurological: Positive for sensory change and weakness. Negative for loss of consciousness.  Psychiatric/Behavioral: Negative for memory loss and substance abuse.  Otherwise a 10 point review of systems was completed and negative except for except noted above in HPI  Past Medical History:  Diagnosis Date  . Cancer ([place])   . Colon cancer ([place])   . Lymphedema   . Obesity   . Pneumonia     Past Surgical History:  Procedure Laterality Date  . ABDOMINAL HYSTERECTOMY    . CESAREAN SECTION    . COLON SURGERY    . HERNIA REPAIR       reports that she quit smoking about 3 years ago. Her smoking use included cigarettes. She smoked 0.50 packs per day. She has never used smokeless tobacco. She reports current drug use. Drug: Marijuana. She reports that she does not drink alcohol.  Allergies  Allergen Reactions  . Diphenhydramine Anaphylaxis and Hives  . Codeine Itching and Nausea And Vomiting  . Morphine And Related Itching and Nausea And Vomiting  . Latex Hives and Other (See Comments)    Powdered gloves    Family History  Problem Relation Age of Onset  . Hypertension Mother   . Healthy Father     Prior to Admission medications  Medication Sig Start Date End Date Taking? Authorizing Provider  ARIPiprazole (ABILIFY) 5 MG tablet Take 5 mg by mouth daily. 04/22/19  Yes [provider]  chlorthalidone (HYGROTON) 25 MG tablet Take 25 mg by mouth daily. 04/22/19  Yes [provider]  clonazePAM (KLONOPIN) 0.5 MG tablet Take 0.5 mg by mouth daily as needed for anxiety.  04/22/19  Yes [provider]  DULoxetine (CYMBALTA) 30  MG capsule Take 30 mg by mouth daily. 04/22/19  Yes [provider]  ibuprofen (ADVIL) 800 MG tablet Take 800 mg by mouth every 8 (eight) hours as needed for mild pain.   Yes [provider]  meloxicam (MOBIC) 15 MG tablet Take 15 mg by mouth daily. 04/22/19  Yes [provider]  metoprolol succinate (TOPROL-XL) 25 MG 24 hr tablet Take 25 mg by mouth daily. 04/22/19  Yes [provider]  potassium chloride (K-DUR) 10 MEQ tablet Take 10 mEq by mouth daily. 04/22/19  Yes [provider]  telmisartan (MICARDIS) 80 MG tablet Take 80 mg by mouth daily. 04/22/19  Yes [provider]  topiramate (TOPAMAX) 25 MG tablet Take 25 mg by mouth 2 (two) times daily. 02/18/19  Yes [provider]  VESICARE 10 MG tablet Take 10 mg by mouth daily. 04/22/19  Yes [provider]  VICTOZA 18 MG/3ML SOPN Inject 1.8 mg into the skin daily. 03/22/19  Yes [provider]    Physical Exam:  Constitutional: Morbidly obese female in NAD, calm, comfortable Vitals:   05/03/19 1034 05/03/19 1100 05/03/19 1130 05/03/19 1230  BP: (!) 151/83 (!) 136/102 138/67 129/76  Pulse: 66 79 76 79  Resp: _0 Temp:      TempSrc:      SpO2: 99% 99% 99% 100%  Weight:      Height:       Eyes: PERRL, lids and conjunctivae normal ENMT: Mucous membranes are moist. Posterior pharynx clear of any exudate or lesions.Normal dentition.  Neck: normal, supple, no masses, no thyromegaly Respiratory: clear to auscultation bilaterally, no wheezing, no crackles. Normal respiratory effort. No accessory muscle use.  Cardiovascular: Regular rate and rhythm, no murmurs / rubs / gallops. No extremity edema. 2+ pedal pulses. No carotid bruits.  Abdomen: no tenderness with colostomy in place. Musculoskeletal: no clubbing / cyanosis. No joint deformity upper and lower extremities. Good ROM, no contractures. Normal muscle tone.  Skin: Blister present on the shin of the right leg  with surrounding erythema as seen below.   Neurologic: CN 2-12 grossly intact. Sensation intact, DTR normal. Strength 5/5 in all 4.  Psychiatric: Normal judgment and insight. Alert and oriented x 3. Normal mood.     Labs on Admission: I have personally reviewed following labs and imaging studies  CBC: Recent Labs  Lab 05/03/19 1021  WBC 6.5  NEUTROABS 4.5  HGB 12.0  HCT 39.4  MCV 91.8  PLT 287   Basic Metabolic Panel: Recent Labs  Lab 05/03/19 0951 05/03/19 1021  NA 139  --   K 3.7  --   CL 107  --   CO2 23  --   GLUCOSE 187*  --   BUN 21*  --   CREATININE 0.83  --   CALCIUM 9.1  --   MG  --  2.1  PHOS  --  3.5   GFR: Estimated Creatinine Clearance: 133.4 mL/min (by C-G formula based on SCr of 0.83 mg/dL). Liver Function Tests: Recent Labs  Lab  05/03/19 1021  AST 46*  ALT 72*  ALKPHOS 102  BILITOT 0.4  PROT 6.7  ALBUMIN 3.0*   No results for input(s): LIPASE, AMYLASE in the last 168 hours. No results for input(s): AMMONIA in the last 168 hours. Coagulation Profile: Recent Labs  Lab 05/03/19 1021  INR 1.0   Cardiac Enzymes: No results for input(s): CKTOTAL, CKMB, CKMBINDEX, TROPONINI in the last 168 hours. BNP (last 3 results) No results for input(s): PROBNP in the last 8760 hours. HbA1C: No results for input(s): HGBA1C in the last 72 hours. CBG: Recent Labs  Lab 05/03/19 0856  GLUCAP 179*   Lipid Profile: No results for input(s): CHOL, HDL, LDLCALC, TRIG, CHOLHDL, LDLDIRECT in the last 72 hours. Thyroid Function Tests: Recent Labs    05/03/19 1021  TSH 3.997   Anemia Panel: No results for input(s): VITAMINB12, FOLATE, FERRITIN, TIBC, IRON, RETICCTPCT in the last 72 hours. Urine analysis:    Component Value Date/Time   COLORURINE YELLOW 05/03/2019 [place] 05/03/2019 0937   LABSPEC 1.012 05/03/2019 0937   PHURINE 6.0 05/03/2019 0937   GLUCOSEU 50 (A) 05/03/2019 0937   HGBUR MODERATE (A) 05/03/2019 0937    BILIRUBINUR NEGATIVE 05/03/2019 0937   KETONESUR NEGATIVE 05/03/2019 0937   PROTEINUR NEGATIVE 05/03/2019 0937   UROBILINOGEN 1.0 05/13/2014 1039   NITRITE NEGATIVE 05/03/2019 0937   LEUKOCYTESUR SMALL (A) 05/03/2019 0937   Sepsis Labs: Recent Results (from the past 240 hour(s))  SARS Coronavirus 2 [facility] order, Performed in [facility] hospital lab) Nasopharyngeal Nasopharyngeal Swab     Status: None   Collection Time: 05/03/19 10:36 AM   Specimen: Nasopharyngeal Swab  Result Value Ref Range Status   SARS Coronavirus 2 NEGATIVE NEGATIVE Final    Comment: (NOTE) If result is NEGATIVE SARS-CoV-2 target nucleic acids are NOT DETECTED. The SARS-CoV-2 RNA is generally detectable in upper and lower  respiratory specimens during the acute phase of infection. The lowest  concentration of SARS-CoV-2 viral copies this assay can detect is 250  copies / mL. A negative result does not preclude SARS-CoV-2 infection  and should not be used as the sole basis for treatment or other  patient management decisions.  A negative result may occur with  improper specimen collection / handling, submission of specimen other  than nasopharyngeal swab, presence of viral mutation(s) within the  areas targeted by this assay, and inadequate number of viral copies  (<250 copies / mL). A negative result must be combined with clinical  observations, patient history, and epidemiological information. If result is POSITIVE SARS-CoV-2 target nucleic acids are DETECTED. The SARS-CoV-2 RNA is generally detectable in upper and lower  respiratory specimens dur ing the acute phase of infection.  Positive  results are indicative of active infection with SARS-CoV-2.  Clinical  correlation with patient history and other diagnostic information is  necessary to determine patient infection status.  Positive results do  not rule out bacterial infection or co-infection with other viruses. If result is PRESUMPTIVE POSTIVE  SARS-CoV-2 nucleic acids MAY BE PRESENT.   A presumptive positive result was obtained on the submitted specimen  and confirmed on repeat testing.  While 2019 novel coronavirus  (SARS-CoV-2) nucleic acids may be present in the submitted sample  additional confirmatory testing may be necessary for epidemiological  and / or clinical management purposes  to differentiate between  SARS-CoV-2 and other Sarbecovirus currently known to infect humans.  If clinically indicated additional testing with an alternate test  methodology (  [id number]) is advised. The SARS-CoV-2 RNA is generally  detectable in upper and lower respiratory sp ecimens during the acute  phase of infection. The expected result is Negative. Fact Sheet for Patients:  StrictlyIdeas.no Fact Sheet for Healthcare Providers: BankingDealers.co.za This test is not yet approved or cleared by the [place] FDA and has been authorized for detection and/or diagnosis of SARS-CoV-2 by FDA under an Emergency Use Authorization (EUA).  This EUA will remain in effect (meaning this test can be used) for the duration of the COVID-19 declaration under Section 564(b)(1) of the Act, 21 U.S.C. section 360bbb-3(b)(1), unless the authorization is terminated or revoked sooner. Performed at [facility], [place] [address], [place], [place] [address]      Radiological Exams on Admission: Dg Chest Port 1 View  Result Date: 05/03/2019 CLINICAL DATA:  Weakness EXAM: PORTABLE CHEST 1 VIEW COMPARISON:  None. FINDINGS: Cardiac shadows within normal limits. The lungs are well aerated bilaterally. No focal infiltrate or sizable effusion is seen. No bony abnormality is noted. IMPRESSION: No acute abnormality noted. Electronically Signed   By: Inez Catalina M.D.   On: 05/03/2019 11:58    EKG: Independently reviewed.  Sinus rhythm at 75 bpm  Assessment/Plan Cellulitis of the legs: Acute.  Patient presents with  bilateral lower extremity redness and swelling most significant of the right leg.  On physical exam patient with erythema and blister draining clear fluid on the right lower extremity.  No significant signs of underlying abscess.  Patient with known history of lymphedema. -Admit to a MedSurg bed  -Follow-up blood and wound cultures  -Check ESR and CRP -Continue empiric antibiotics of vancomycin and Rocephin -Wound care consulted and recommended cleaning regimen and Unna boots  Lactic acidosis: Acute.  On admission lactic acid elevated at 2.1. -Trend lactic acid level   Abnormal UA: Urinalysis was positive for small leukocytes, rare bacteria, 11-20 WBC cells.  Patient had a on empiric antibiotics.  However, patient with previous history of ESBL per review of records. -Follow-up urine culture  Essential hypertension: Blood pressures 117/63-153/96. -Continue metoprolol, chlorthalidone, pharmacy substitution of Micardis   Hyperglycemia: Acute.  Patient denies any history of diabetes.  On admission blood glucose elevated up to 187.  Last hemoglobin A1c 5.9 on 12/01/2017 on care everywhere. -Follow-up hemoglobin A1c in a.m.   -Continue to monitor and place on sliding scale insulin if needed   Depression -Continue Cymbalta and Abilify  History of colon cancer and lymphedema: Patient status post colostomy. -Ostomy care -Unna boots per wound care  Morbid obesity: BMI 60.04 kg/m.  Patient on Victoza for treatment. -Continue Victoza     DVT prophylaxis:  Lovenox  Code Status:  Full  Family Communication: No family present at bedside Disposition Plan: Likely discharge home in 1 3 days. Consults called: Wound care nurse Admission status: observation  Norval Morton MD [facility] Pager [phone]   If 7PM-7AM, please contact night-coverage [email or web address] Password [facility]  05/03/2019, 12:48 PM

## 2019-05-03 NOTE — Progress Notes (Signed)
Pharmacy Antibiotic Note  Martha Wood is a 48 y.o. female admitted on 05/03/2019 with cellulitis.  Pharmacy has been consulted for vancomycin dosing. Pt is afebrile and WBC is WNL. Scr and lactic acid are now both WNL. She was also started on ceftriaxone and metronidazole. A dose of 1g of vancomycin is currently infusing.   Plan: Give additional 1gm vancomcyin IV x 1 to complete a loading dose of 2gm  Then vancomycin 1500mg  IV Q12H F/u renal fxn, C&S, clinical status and peak/trough at SS  Height: 5\' 6"  (167.6 cm) Weight: (!) 372 lb (168.7 kg) IBW/kg (Calculated) : 59.3  Temp (24hrs), Avg:98.4 F (36.9 C), Min:98.4 F (36.9 C), Max:98.4 F (36.9 C)  Recent Labs  Lab 05/03/19 0951 05/03/19 1021 05/03/19 1210  WBC  --  6.5  --   CREATININE 0.83  --   --   LATICACIDVEN  --  2.1* 1.6    Estimated Creatinine Clearance: 133.4 mL/min (by C-G formula based on SCr of 0.83 mg/dL).    Allergies  Allergen Reactions  . Diphenhydramine Anaphylaxis and Hives  . Codeine Itching and Nausea And Vomiting  . Morphine And Related Itching and Nausea And Vomiting  . Latex Hives and Other (See Comments)    Powdered gloves    Antimicrobials this admission: Vanc 8/17>> CTX 8/17>> Flagyl 8/17>>   Dose adjustments this admission: N/A  Microbiology results: Pending  Thank you for allowing pharmacy to be a part of this patient's care.  Tajae Rybicki, Rande Lawman 05/03/2019 1:19 PM

## 2019-05-03 NOTE — ED Notes (Signed)
Patient also has right leg edema with weeping.

## 2019-05-03 NOTE — Plan of Care (Signed)
  Problem: Education: Goal: Knowledge of General Education information will improve Description: Including pain rating scale, medication(s)/side effects and non-pharmacologic comfort measures Outcome: Progressing   Problem: Clinical Measurements: Goal: Ability to maintain clinical measurements within normal limits will improve Outcome: Progressing Goal: Will remain free from infection Outcome: Progressing   Problem: Activity: Goal: Risk for activity intolerance will decrease Outcome: Progressing   Problem: Pain Managment: Goal: General experience of comfort will improve Outcome: Progressing   Problem: Safety: Goal: Ability to remain free from injury will improve Outcome: Progressing   Problem: Skin Integrity: Goal: Risk for impaired skin integrity will decrease Outcome: Progressing   

## 2019-05-03 NOTE — Plan of Care (Signed)

## 2019-05-03 NOTE — ED Provider Notes (Signed)
[facility] EMERGENCY DEPARTMENT Provider Note   CSN: [id number] Arrival date & time: 05/03/19  0848    History   Chief Complaint Chief Complaint  Patient presents with  . Hyperglycemia  . Weakness    HPI Martha Wood is a 48 y.o. female.     HPI Patient reports that she started feeling "bad" on Saturday, 2 days ago.  Generalized with feelings of weakness and nausea.  Patient denies any vomiting.  She has not been coughing but reports she does feel somewhat short of breath.  No chest pain.  She does have colostomy (due to history of colon cancer) and reports there is been some increase in loose stool output.  Patient reports that she has chronic lymphedema but her legs are more swollen and they both actually just feel numb and cold.  She has a wound that developed on the front of the right leg and has been weeping. Past Medical History:  Diagnosis Date  . Cancer ([place])   . Colon cancer ([place])   . Lymphedema   . Obesity   . Pneumonia     There are no active problems to display for this patient.   Past Surgical History:  Procedure Laterality Date  . ABDOMINAL HYSTERECTOMY    . CESAREAN SECTION    . COLON SURGERY    . HERNIA REPAIR       OB History   No obstetric history on file.      Home Medications    Prior to Admission medications   Medication Sig Start Date End Date Taking? Authorizing Provider  ARIPiprazole (ABILIFY) 5 MG tablet Take 5 mg by mouth daily. 04/22/19  Yes [provider]  chlorthalidone (HYGROTON) 25 MG tablet Take 25 mg by mouth daily. 04/22/19  Yes [provider]  clonazePAM (KLONOPIN) 0.5 MG tablet Take 0.5 mg by mouth daily as needed for anxiety.  04/22/19  Yes [provider]  DULoxetine (CYMBALTA) 30 MG capsule Take 30 mg by mouth daily. 04/22/19  Yes [provider]  ibuprofen (ADVIL) 800 MG tablet Take 800 mg by mouth every 8 (eight) hours as needed for mild pain.   Yes [provider]  meloxicam (MOBIC) 15 MG tablet Take 15 mg by mouth daily. 04/22/19  Yes [provider]  metoprolol succinate (TOPROL-XL) 25 MG 24 hr tablet Take 25 mg by mouth daily. 04/22/19  Yes [provider]  potassium chloride (K-DUR) 10 MEQ tablet Take 10 mEq by mouth daily. 04/22/19  Yes [provider]  telmisartan (MICARDIS) 80 MG tablet Take 80 mg by mouth daily. 04/22/19  Yes [provider]  topiramate (TOPAMAX) 25 MG tablet Take 25 mg by mouth 2 (two) times daily. 02/18/19  Yes [provider]  VESICARE 10 MG tablet Take 10 mg by mouth daily. 04/22/19  Yes [provider]  VICTOZA 18 MG/3ML SOPN Inject 1.8 mg into the skin daily. 03/22/19  Yes [provider]    Family History Family History  Problem Relation Age of Onset  . Hypertension Mother   . Healthy Father     Social History Social History   Tobacco Use  . Smoking status: Former Smoker    Packs/day: 0.50    Types: Cigarettes    Quit date: 2017    Years since quitting: 3.6  . Smokeless tobacco: Never Used  Substance Use Topics  . Alcohol use: No  . Drug use: Yes    Types: Marijuana  Allergies   Diphenhydramine, Codeine, Morphine and related, and Latex   Review of Systems Review of Systems 10 Systems reviewed and are negative for acute change except as noted in the HPI.   Physical Exam Updated Vital Signs BP 136/80   Pulse 80   Temp 98.4 F (36.9 C) (Oral)   Resp 17   Ht 5\' 6"  (1.676 m)   Wt (!) 168.7 kg   LMP 05/30/2014   SpO2 100%   BMI 60.04 kg/m   Physical Exam Constitutional:      Comments: Patient is alert and appropriate.  No respiratory distress.  Mental status is clear.  Morbid obesity.  HENT:     Head: Normocephalic and atraumatic.     Mouth/Throat:     Mouth: Mucous membranes are moist.     Pharynx: Oropharynx is clear.  Eyes:     Extraocular Movements: Extraocular movements intact.     Pupils: Pupils are equal, round, and  reactive to light.  Cardiovascular:     Rate and Rhythm: Normal rate and regular rhythm.  Pulmonary:     Comments: Anterior auscultation of chest wall.  Grossly clear.  No rhonchi.  No respiratory distress. Abdominal:     Comments: Morbid obesity of the abdomen.  Patient has colostomy left abdominal wall.  Does not endorse tenderness to palpation.  Musculoskeletal:     Comments: Bilateral lower extremities edematous 2+.  Feet are very puffy bilaterally 3+.  Both lower extremities are bright pink erythema with blanching and very warm to the touch.  Feet are warm to the touch.  Right lower extremity has a ulcerative wound with yellow eschar approximately 1 cm roundish with some drainage.  Neurological:     General: No focal deficit present.     Mental Status: She is oriented to person, place, and time.     Coordination: Coordination normal.  Psychiatric:        Mood and Affect: Mood normal.      ED Treatments / Results  Labs (all labs ordered are listed, but only abnormal results are displayed) Labs Reviewed  BASIC METABOLIC PANEL - Abnormal; Notable for the following components:      Result Value   Glucose, Bld 187 (*)    BUN 21 (*)    All other components within normal limits  URINALYSIS, ROUTINE W REFLEX MICROSCOPIC - Abnormal; Notable for the following components:   Glucose, UA 50 (*)    Hgb urine dipstick MODERATE (*)    Leukocytes,Ua SMALL (*)    Bacteria, UA RARE (*)    All other components within normal limits  LACTIC ACID, PLASMA - Abnormal; Notable for the following components:   Lactic Acid, Venous 2.1 (*)    All other components within normal limits  HEPATIC FUNCTION PANEL - Abnormal; Notable for the following components:   Albumin 3.0 (*)    AST 46 (*)    ALT 72 (*)    Indirect Bilirubin 0.2 (*)    All other components within normal limits  CBC WITH DIFFERENTIAL/PLATELET - Abnormal; Notable for the following components:   RDW 18.9 (*)    Abs Immature  Granulocytes 0.12 (*)    All other components within normal limits  CBG MONITORING, ED - Abnormal; Notable for the following components:   Glucose-Capillary 179 (*)    All other components within normal limits  SARS CORONAVIRUS 2 (HOSPITAL ORDER, [place])  CULTURE, BLOOD (ROUTINE X 2)  CULTURE, BLOOD (ROUTINE  X 2)  AEROBIC CULTURE (SUPERFICIAL SPECIMEN)  BRAIN NATRIURETIC PEPTIDE  LACTIC ACID, PLASMA  PROTIME-INR  MAGNESIUM  PHOSPHORUS  TSH  I-STAT BETA HCG BLOOD, ED ([facility], [facility], [facility] ONLY)  TROPONIN I (HIGH SENSITIVITY)  TROPONIN I (HIGH SENSITIVITY)    EKG EKG Interpretation  Date/Time:  Monday May 03 2019 08:53:59 EDT Ventricular Rate:  75 PR Interval:    QRS Duration: 98 QT Interval:  385 QTC Calculation: 430 R Axis:   71 Text Interpretation:  Sinus rhythm Borderline repolarization abnormality no sig change Confirmed by Charlesetta Shanks [phone]) on 05/03/2019 9:22:24 AM   Radiology Dg Chest Port 1 View  Result Date: 05/03/2019 CLINICAL DATA:  Weakness EXAM: PORTABLE CHEST 1 VIEW COMPARISON:  None. FINDINGS: Cardiac shadows within normal limits. The lungs are well aerated bilaterally. No focal infiltrate or sizable effusion is seen. No bony abnormality is noted. IMPRESSION: No acute abnormality noted. Electronically Signed   By: Inez Catalina M.D.   On: 05/03/2019 11:58    Procedures Procedures (including critical care time)  Medications Ordered in ED Medications  vancomycin (VANCOCIN) IVPB 1000 mg/200 mL premix (1,000 mg Intravenous New Bag/Given 05/03/19 1301)  cefTRIAXone (ROCEPHIN) 1 g in sodium chloride 0.9 % 100 mL IVPB (0 g Intravenous Stopped 05/03/19 1245)  sodium chloride 0.9 % bolus 500 mL (500 mLs Intravenous New Bag/Given 05/03/19 1213)     Initial Impression / Assessment and Plan / ED Course  I have reviewed the triage vital signs and the nursing notes.  Pertinent labs & imaging results that were available during my care of  the patient were reviewed by me and considered in my medical decision making (see chart for details).       Patient presents with lower extremity swelling and redness.  She has a wound to the right lower extremity that has drainage.  Patient does have elevated blood sugar in the 170s.  I suspect new onset diabetes.  Patient however does not have acidosis or anion gap.  Patient appears to have significant cellulitis with erythema and blanching of the entirety of the leg and the foot with associated wound.  At this time, plan will be for admission and IV antibiotics.  With a separative wound possibility is for staph infection versus drainage with venous stasis ulcer.  Notably however patient does not seem to have any wounds or ulcers elsewhere on the legs.  Final Clinical Impressions(s) / ED Diagnoses   Final diagnoses:  Cellulitis of lower extremity, unspecified laterality  Diabetes mellitus, new onset [facility])    ED Discharge Orders    None       Charlesetta Shanks, MD 05/03/19 1311

## 2019-05-03 NOTE — Consult Note (Signed)
[place] Nurse wound consult note Reason for Consult: Patient with chronic lymphedema and recent exacerbation.  Blistering has lead to nonintact lesion to right anterior lower leg.  Acute edema from dorsal foot to knee.  Normally does not require compression.  She is going to stay overnight for observation  Will implement Unna boot to be removed Friday at home by her boyfriend who helps care for her.  Wound type:inflammatory Pressure Injury POA: NA Measurement: 1 cm ruptured serum filled blister  Wound EFE:OFHQ moist Drainage (amount, consistency, odor)minimal serous weeping.   Periwound:edema and erythema to bilateral lower legs Dressing procedure/placement/frequency: Cleanse legs with soap and water and pat dry.  Bedside RN to apply Aquacel Ag Kellie Simmering # [phone]).  Ortho tech to apply Unna boots to bilateral lower legs.  Remove on Friday 05/07/19.  Boyfriend can assist.   [place] Nurse ostomy consult note Stoma type/location: LMQ colostomy  Hx colon cancer.  Independent in care.  Pouch intact Stomal assessment/size: 1 1/2" pink and moist Peristomal assessment: not assessed Treatment options for stomal/peristomal skin: 2 piece 2 3/4" pouch Output soft brown stool Ostomy pouching: 2pc. 2 3/4" pouch  Education provided: None needed.  Enrolled patient in [place] program: No Will not follow at this time.  Please re-consult if needed.  Domenic Moras MSN, RN, FNP-BC CWON Wound, Ostomy, Continence Nurse Pager [phone]

## 2019-05-03 NOTE — ED Notes (Signed)
One set of culture drawn by phlebotomy. MD aware of patient being difficult to obtain blood work. States to start antibiotics.

## 2019-05-03 NOTE — Progress Notes (Signed)
Late entry: Patient was having severe  pain at her previous IV site, nurse tried to flush it but it was [place], New IV STAT ordered for patient since patient was a hard stick, was having pain at the IV site and also receiving continues IV antibiotics. New IV placed, old IV removed, will continue to monitor.

## 2019-05-04 DIAGNOSIS — Z6841 Body Mass Index (BMI) 40.0 and over, adult: Secondary | ICD-10-CM

## 2019-05-04 DIAGNOSIS — E119 Type 2 diabetes mellitus without complications: Secondary | ICD-10-CM

## 2019-05-04 DIAGNOSIS — L03116 Cellulitis of left lower limb: Secondary | ICD-10-CM

## 2019-05-04 DIAGNOSIS — L03115 Cellulitis of right lower limb: Principal | ICD-10-CM

## 2019-05-04 LAB — CBC
HCT: 37.3 % (ref 36.0–46.0)
Hemoglobin: 11.7 g/dL — ABNORMAL LOW (ref 12.0–15.0)
MCH: 28.1 pg (ref 26.0–34.0)
MCHC: 31.4 g/dL (ref 30.0–36.0)
MCV: 89.7 fL (ref 80.0–100.0)
Platelets: 250 10*3/uL (ref 150–400)
RBC: 4.16 MIL/uL (ref 3.87–5.11)
RDW: 18.9 % — ABNORMAL HIGH (ref 11.5–15.5)
WBC: 4.8 10*3/uL (ref 4.0–10.5)
nRBC: 0 % (ref 0.0–0.2)

## 2019-05-04 LAB — HEMOGLOBIN A1C
Hgb A1c MFr Bld: 6.9 % — ABNORMAL HIGH (ref 4.8–5.6)
Mean Plasma Glucose: 151.33 mg/dL

## 2019-05-04 LAB — BASIC METABOLIC PANEL
Anion gap: 10 (ref 5–15)
BUN: 17 mg/dL (ref 6–20)
CO2: 23 mmol/L (ref 22–32)
Calcium: 8.3 mg/dL — ABNORMAL LOW (ref 8.9–10.3)
Chloride: 104 mmol/L (ref 98–111)
Creatinine, Ser: 0.89 mg/dL (ref 0.44–1.00)
GFR calc Af Amer: >60 mL/min (ref >60)
GFR calc non Af Amer: >60 mL/min (ref >60)
Glucose, Bld: 172 mg/dL — ABNORMAL HIGH (ref 70–99)
Potassium: 3.5 mmol/L (ref 3.5–5.1)
Sodium: 137 mmol/L (ref 135–145)

## 2019-05-04 LAB — GLUCOSE, CAPILLARY
Glucose-Capillary: 244 mg/dL — ABNORMAL HIGH (ref 70–99)
Glucose-Capillary: 156 mg/dL — ABNORMAL HIGH (ref 70–99)
Glucose-Capillary: 184 mg/dL — ABNORMAL HIGH (ref 70–99)

## 2019-05-04 LAB — HIV ANTIBODY (ROUTINE TESTING W REFLEX): HIV Screen 4th Generation wRfx: NONREACTIVE

## 2019-05-04 LAB — PREALBUMIN: Prealbumin: 22.5 mg/dL (ref 18–38)

## 2019-05-04 MED ORDER — LIVING WELL WITH DIABETES BOOK
Freq: Once | Status: AC
  Administered 2019-05-04: 13:00:00
  Filled 2019-05-04: qty 1

## 2019-05-04 MED ORDER — INSULIN ASPART 100 UNIT/ML ~~LOC~~ SOLN
0.0000 [IU] | Freq: Three times a day (TID) | SUBCUTANEOUS | Status: DC
  Administered 2019-05-04: 3 [IU] via SUBCUTANEOUS
  Administered 2019-05-04 – 2019-05-05 (×3): 2 [IU] via SUBCUTANEOUS
  Administered 2019-05-05: 3 [IU] via SUBCUTANEOUS
  Administered 2019-05-06 (×2): 2 [IU] via SUBCUTANEOUS

## 2019-05-04 MED ORDER — SODIUM CHLORIDE 0.9% FLUSH: 10.0000 mL | INTRAVENOUS | Status: DC | PRN

## 2019-05-04 MED ORDER — SODIUM CHLORIDE 0.9% FLUSH
10.0000 mL | Freq: Two times a day (BID) | INTRAVENOUS | Status: DC
  Administered 2019-05-04 – 2019-05-06 (×5): 10 mL

## 2019-05-04 NOTE — Progress Notes (Signed)
Orthopedic Tech Progress Note Patient Details:  Martha Wood 1970/11/07 [id number]  Ortho Devices Type of Ortho Device: Haematologist Ortho Device/Splint Location: Bilateral unna boots Ortho Device/Splint Interventions: Application   Post Interventions Patient Tolerated: Well Instructions Provided: Care of device   [name] 05/04/2019, 1:32 PM

## 2019-05-04 NOTE — Progress Notes (Signed)
PROGRESS NOTE   Martha Wood  PIR:[id number]    DOB: 05-13-1971    DOA: 05/03/2019  PCP: Patient, No Pcp Per   I have briefly reviewed patients previous medical records in [facility].  Chief Complaint  Patient presents with  . Hyperglycemia  . Weakness    Brief Narrative:  48 year old female, lives with her boyfriend, uses Rollator or cane as needed, PMH of stage III rectal cancer s/p diverting colostomy, neoadjuvant chemoradiation, low anterior resection and adjuvant chemotherapy, said to be in remission, DVT completed anticoagulation course, nephrolithiasis/benign tumor of left ureter, hydronephrosis due to ureteral stricture, s/p left ureteroscopy and stent exchange, chronic bilateral lower extremity lymphedema since her cancer treatment, HTN, morbid obesity, to [facility] ED 8/17 due to couple days history of generally not feeling well, progressively worsening bilateral lower extremity swelling and pain with associated redness over the last 1 to 2 weeks, chills without fevers, to an extent of limiting ambulation.  Admitted for suspected cellulitis complicating bilateral lower extremity lymphedema.  Slowly improving.   Assessment & Plan:   Principal Problem:   Cellulitis of leg Active Problems:   Lactic acidosis   Hyperglycemia   Depression   History of colon cancer   Lymphedema   Bilateral leg cellulitis complicating chronic lymphedema  Denies history of trauma.  Reports that diuretics do not work for her lymphedema.  Lymphedema related to prior cancer surgery and radiation.  Had small blister and now a tiny minimally draining ulcer over right shin.  CRP 1.1, minimally elevated lactate has normalized, ESR 34, HIV antibody screen nonreactive, right shin wound Gram stain shows few Staphylococcus aureus.  Blood cultures x2: Negative to date.  Although no fever or leukocytosis, has clinical cellulitis.  Continue empirically started IV ceftriaxone and vancomycin for additional 24  hours.  Risk of slow healing, worsening, recurrence due to body habitus i.e. lymphedema with poor penetration and due to morbid obesity.  Awaiting Unna boots which should help.  Elevate feet.  Essential hypertension  Controlled on home Micardis (substituted by irbesartan), chlorthalidone, Toprol-XL.  Continue.  Newly diagnosed type II DM  A1c 6.9. TSH: 3.997.  Counseled patient regarding her new diagnosis.  She was taking Victoza for weight loss, now can continue for DM.  DM coordinator and dietitian consulted.  Continue liraglutide and add NovoLog SSI.  Anxiety and depression  Continue Abilify, duloxetine and Topamax.  Very morbid obesity/Body mass index is 60.04 kg/m.  Appears to have seen bariatric surgery in the past.  Outpatient follow-up.  Asymptomatic bacteriuria  Has chronic urinary frequency related to prior radiation.  Does have history of nephrolithiasis and ureteral stent placement.  History of rectal cancer status post diverting colostomy  Reportedly in remission.  Outpatient follow-up.  Anemia  Follow CBC in a.m.  Transient dyspnea  Resolved.  Unclear etiology.?  Related to body habitus and acute illness.  BNP 34.  HS troponins x2.  Chest x-ray without acute findings.  Low index of suspicion for PE.  Mild transaminitis  Unclear etiology.  No GI symptoms.  Outpatient follow-up.  DVT prophylaxis: Lovenox Code Status: Full Family Communication: None at bedside Disposition: DC home pending clinical improvement, hopefully 8/19.   Consultants:  WOC  Procedures:  None  Antimicrobials:  IV vancomycin and ceftriaxone 8/17>   Subjective: History as noted above.  Feels slightly better with some improvement in pain but no significant change in redness in her legs.  States that this is the worst swelling that she has had  in her legs.  Was able to get up to the bathroom early this morning.  No dyspnea, chest pain, asymmetric limb weakness,  numbness or tingling.  Objective:  Vitals:   05/03/19 1619 05/03/19 2040 05/04/19 0447 05/04/19 0831  BP: 117/63 115/69 125/60 (!) 130/59  Pulse: 75 78 70 80  Resp: 16   16  Temp: 98.4 F (36.9 C) (!) 97.5 F (36.4 C) 97.8 F (36.6 C) (!) 97.3 F (36.3 C)  TempSrc: Oral Oral Oral Oral  SpO2: 100% 98% 99% 99%  Weight:      Height:        Examination:  General exam: Pleasant young female, moderately built and very morbidly obese lying comfortably propped up in bed without distress. Respiratory system: Clear to auscultation. Respiratory effort normal. Cardiovascular system: S1 & S2 heard, RRR. No JVD, murmurs, rubs, gallops or clicks.  Chronic bilateral leg lymphedema.  Nonpitting. Gastrointestinal system: Abdomen is nondistended, soft and nontender. No organomegaly or masses felt. Normal bowel sounds heard.  Colostomy intact without acute findings or leak. Central nervous system: Alert and oriented. No focal neurological deficits. Extremities: Symmetric 5 x 5 power.  Bilateral legs with faint redness, increased warmth, exquisite tenderness and a pinhole sized superficial ulcer on the right mid shin (at site of prior ruptured blister) with minimal serosanguineous drainage.  No crepitus or fluctuation.  Neurovascular bundle intact. Skin: No rashes, lesions or ulcers Psychiatry: Judgement and insight appear normal. Mood & affect appropriate.     Data Reviewed: I have personally reviewed following labs and imaging studies  CBC: Recent Labs  Lab 05/03/19 1021 05/04/19 0635  WBC 6.5 4.8  NEUTROABS 4.5  --   HGB 12.0 11.7*  HCT 39.4 37.3  MCV 91.8 89.7  PLT 251 741   Basic Metabolic Panel: Recent Labs  Lab 05/03/19 0951 05/03/19 1021 05/04/19 0635  NA 139  --  137  K 3.7  --  3.5  CL 107  --  104  CO2 23  --  23  GLUCOSE 187*  --  172*  BUN 21*  --  17  CREATININE 0.83  --  0.89  CALCIUM 9.1  --  8.3*  MG  --  2.1  --   PHOS  --  3.5  --    Liver Function Tests:  Recent Labs  Lab 05/03/19 1021  AST 46*  ALT 72*  ALKPHOS 102  BILITOT 0.4  PROT 6.7  ALBUMIN 3.0*    Cardiac Enzymes: No results for input(s): CKTOTAL, CKMB, CKMBINDEX, TROPONINI in the last 168 hours.  CBG: Recent Labs  Lab 05/03/19 0856  GLUCAP 179*    Recent Results (from the past 240 hour(s))  SARS Coronavirus 2 [facility] Series order, Performed in [facility] hospital lab) Nasopharyngeal Nasopharyngeal Swab     Status: None   Collection Time: 05/03/19 10:36 AM   Specimen: Nasopharyngeal Swab  Result Value Ref Range Status   SARS Coronavirus 2 NEGATIVE NEGATIVE Final    Comment: (NOTE) If result is NEGATIVE SARS-CoV-2 target nucleic acids are NOT DETECTED. The SARS-CoV-2 RNA is generally detectable in upper and lower  respiratory specimens during the acute phase of infection. The lowest  concentration of SARS-CoV-2 viral copies this assay can detect is 250  copies / mL. A negative result does not preclude SARS-CoV-2 infection  and should not be used as the sole basis for treatment or other  patient management decisions.  A negative result may occur with  improper specimen collection /  handling, submission of specimen other  than nasopharyngeal swab, presence of viral mutation(s) within the  areas targeted by this assay, and inadequate number of viral copies  (<250 copies / mL). A negative result must be combined with clinical  observations, patient history, and epidemiological information. If result is POSITIVE SARS-CoV-2 target nucleic acids are DETECTED. The SARS-CoV-2 RNA is generally detectable in upper and lower  respiratory specimens dur ing the acute phase of infection.  Positive  results are indicative of active infection with SARS-CoV-2.  Clinical  correlation with patient history and other diagnostic information is  necessary to determine patient infection status.  Positive results do  not rule out bacterial infection or co-infection with other viruses. If  result is PRESUMPTIVE POSTIVE SARS-CoV-2 nucleic acids MAY BE PRESENT.   A presumptive positive result was obtained on the submitted specimen  and confirmed on repeat testing.  While 2019 novel coronavirus  (SARS-CoV-2) nucleic acids may be present in the submitted sample  additional confirmatory testing may be necessary for epidemiological  and / or clinical management purposes  to differentiate between  SARS-CoV-2 and other Sarbecovirus currently known to infect humans.  If clinically indicated additional testing with an alternate test  methodology [phone]) is advised. The SARS-CoV-2 RNA is generally  detectable in upper and lower respiratory sp ecimens during the acute  phase of infection. The expected result is Negative. Fact Sheet for Patients:  StrictlyIdeas.no Fact Sheet for Healthcare Providers: BankingDealers.co.za This test is not yet approved or cleared by the [place] FDA and has been authorized for detection and/or diagnosis of SARS-CoV-2 by FDA under an Emergency Use Authorization (EUA).  This EUA will remain in effect (meaning this test can be used) for the duration of the COVID-19 declaration under Section 564(b)(1) of the Act, 21 U.S.C. section 360bbb-3(b)(1), unless the authorization is terminated or revoked sooner. Performed at [facility], [place] [address] [place], [place] [address]   Culture, blood (routine x 2)     Status: None (Preliminary result)   Collection Time: 05/03/19 12:14 PM   Specimen: BLOOD LEFT ARM  Result Value Ref Range Status   Specimen Description BLOOD LEFT ARM  Final   Special Requests   Final    BOTTLES DRAWN AEROBIC ONLY Blood Culture adequate volume   Culture   Final    NO GROWTH < 24 HOURS Performed at [facility], [place] [address], [place], [place] [address]    Report Status PENDING  Incomplete  Wound or Superficial Culture     Status: None (Preliminary result)    Collection Time: 05/03/19 12:22 PM   Specimen: Wound  Result Value Ref Range Status   Specimen Description WOUND LEG SHIN  Final   Special Requests NONE  Final   Gram Stain   Final    FEW SQUAMOUS EPITHELIAL CELLS PRESENT NO WBC SEEN FEW GRAM POSITIVE COCCI    Culture   Final    FEW STAPHYLOCOCCUS AUREUS CULTURE REINCUBATED FOR BETTER GROWTH Performed at [facility], [place] [address], [place], [place] [address]    Report Status PENDING  Incomplete  Culture, blood (routine x 2)     Status: None (Preliminary result)   Collection Time: 05/03/19  5:43 PM   Specimen: BLOOD RIGHT FOREARM  Result Value Ref Range Status   Specimen Description BLOOD RIGHT FOREARM  Final   Special Requests   Final    BOTTLES DRAWN AEROBIC ONLY Blood Culture results may not be  optimal due to an inadequate volume of blood received in culture bottles   Culture   Final    NO GROWTH < 12 HOURS Performed at [place] [address], [place], [place] [address]    Report Status PENDING  Incomplete         Radiology Studies: Dg Chest Port 1 View  Result Date: 05/03/2019 CLINICAL DATA:  Weakness EXAM: PORTABLE CHEST 1 VIEW COMPARISON:  None. FINDINGS: Cardiac shadows within normal limits. The lungs are well aerated bilaterally. No focal infiltrate or sizable effusion is seen. No bony abnormality is noted. IMPRESSION: No acute abnormality noted. Electronically Signed   By: Inez Catalina M.D.   On: 05/03/2019 11:58        Scheduled Meds: . ARIPiprazole  5 mg Oral Daily  . chlorthalidone  25 mg Oral Daily  . DULoxetine  30 mg Oral Daily  . enoxaparin (LOVENOX) injection  80 mg Subcutaneous Q24H  . irbesartan  300 mg Oral Daily  . liraglutide  1.8 mg Subcutaneous Daily  . metoprolol succinate  25 mg Oral Daily  . potassium chloride  10 mEq Oral Daily  . sodium chloride flush  3 mL Intravenous Q12H  . topiramate  25 mg Oral BID   Continuous Infusions: . cefTRIAXone (ROCEPHIN)  IV 2 g  (05/04/19 0942)  . vancomycin 1,500 mg (05/04/19 0139)     LOS: 1 day     Vernell Leep, MD, FACP, [facility]. [facility]  To contact the attending provider between 7A-7P or the covering provider during after hours 7P-7A, please log into the web site [email or web address] and access using universal  password for that web site. If you do not have the password, please call the hospital operator.  05/04/2019, 9:45 AM

## 2019-05-04 NOTE — Progress Notes (Signed)
Inpatient Diabetes Program Recommendations  AACE/ADA: New Consensus Statement on Inpatient Glycemic Control (2015)  Target Ranges:  Prepandial:   less than 140 mg/dL      Peak postprandial:   less than 180 mg/dL (1-2 hours)      Critically ill patients:  140 - 180 mg/dL   Lab Results  Component Value Date   GLUCAP 179 (H) 05/03/2019   HGBA1C 6.9 (H) 05/04/2019    Review of Glycemic Control  Diabetes history: No prior hx Outpatient Diabetes medications: Victoza for weight loss Current orders for Inpatient glycemic control: Victoza qd + Novolog sensitive tid   Inpatient Diabetes Program Recommendations:   Spoke with patient and discussed type 2 diabetes. Patient has already changed to sugar free drinks but  Will review nutrition log to decrease number of carbohydrates in her meals. Ordered: Living Well With diabetes book Consult to dietician regarding new onset DM2 Patient education videos-Diabetes Nurses, please review diabetes information with patient.  Thank you, Nani Gasser. Meriel Kelliher, RN, MSN, CDE  Diabetes Coordinator Inpatient Glycemic Control Team Team Pager [phone] (8am-5pm) 05/04/2019 2:06 PM

## 2019-05-04 NOTE — Social Work (Signed)
CSW acknowledging consult for access to medications at discharge.  For medication access please consult RN Case Management.  At this time noted pt has Medicaid which covers medication copayments.   CSW signing off. Please consult if any additional needs arise.  Also of note; pt oriented x4; no documentation of court appointed legal guardian. Will discontinue that banner.  Westley Hummer, MSW, [place] [employer] [phone]

## 2019-05-04 NOTE — Progress Notes (Signed)
Changed patient's ostomy appliance due to leakage. Size 51mm, 2 3/4 in.

## 2019-05-05 LAB — GLUCOSE, CAPILLARY
Glucose-Capillary: 152 mg/dL — ABNORMAL HIGH (ref 70–99)
Glucose-Capillary: 192 mg/dL — ABNORMAL HIGH (ref 70–99)
Glucose-Capillary: 210 mg/dL — ABNORMAL HIGH (ref 70–99)
Glucose-Capillary: 144 mg/dL — ABNORMAL HIGH (ref 70–99)

## 2019-05-05 LAB — CBC
HCT: 36.4 % (ref 36.0–46.0)
Hemoglobin: 11.3 g/dL — ABNORMAL LOW (ref 12.0–15.0)
MCH: 27.7 pg (ref 26.0–34.0)
MCHC: 31 g/dL (ref 30.0–36.0)
MCV: 89.2 fL (ref 80.0–100.0)
Platelets: 246 10*3/uL (ref 150–400)
RBC: 4.08 MIL/uL (ref 3.87–5.11)
RDW: 18.5 % — ABNORMAL HIGH (ref 11.5–15.5)
WBC: 5 10*3/uL (ref 4.0–10.5)
nRBC: 0 % (ref 0.0–0.2)

## 2019-05-05 LAB — URINE CULTURE

## 2019-05-05 MED ORDER — DOXYCYCLINE HYCLATE 100 MG PO TABS
100.0000 mg | ORAL_TABLET | Freq: Two times a day (BID) | ORAL | Status: DC
  Administered 2019-05-05 – 2019-05-06 (×3): 100 mg via ORAL
  Filled 2019-05-05 (×3): qty 1

## 2019-05-05 NOTE — Progress Notes (Signed)
PROGRESS NOTE   Martha Wood  MRN:[id number]    DOB: 07/17/1971    DOA: 05/03/2019  PCP: Patient, No Pcp Per    Brief Narrative:  47-year-old female, lives with her boyfriend, uses Rollator or cane as needed, PMH of stage III rectal cancer s/p diverting colostomy, neoadjuvant chemoradiation, low anterior resection and adjuvant chemotherapy, said to be in remission, DVT completed anticoagulation course, nephrolithiasis/benign tumor of left ureter, hydronephrosis due to ureteral stricture, s/p left ureteroscopy and stent exchange, chronic bilateral lower extremity lymphedema since her cancer treatment, HTN, morbid obesity, to [facility] ED 8/17 due to couple days history of generally not feeling well, progressively worsening bilateral lower extremity swelling and pain with associated redness over the last 1 to 2 weeks, chills without fevers, to an extent of limiting ambulation.  Admitted for suspected cellulitis complicating bilateral lower extremity lymphedema.  Slowly improving.   Assessment & Plan:   Principal Problem:   Cellulitis of leg Active Problems:   Lactic acidosis   Hyperglycemia   Depression   History of colon cancer   Lymphedema   Bilateral leg cellulitis complicating chronic lymphedema  Denies history of trauma.  Reports that diuretics do not work for her lymphedema.  Lymphedema related to prior cancer surgery and radiation.  Had small blister and a tiny minimally draining ulcer over right shin.  CRP 1.1, minimally elevated lactate has normalized, ESR 34, HIV antibody screen nonreactive, right shin wound Gram stain shows few Staphylococcus aureus.  Blood cultures x2: Negative to date.  Although no fever or leukocytosis, has clinical cellulitis.  Patient was started on ceftriaxone and vancomycin.  Risk of slow healing, worsening, recurrence due to body habitus i.e. lymphedema with poor penetration and due to morbid obesity.  Unna boots placed yesterday.  Elevate feet.   Overall patient does feel better.  Patient to be mobilized today.  Will change to oral doxycycline.  Essential hypertension  Controlled on home Micardis (substituted by irbesartan), chlorthalidone, Toprol-XL.  Blood pressure is reasonably well controlled.  Newly diagnosed type II DM  A1c 6.9. TSH: 3.997.  Counseled patient regarding her new diagnosis.  She was taking Victoza for weight loss, now can continue for DM.  DM coordinator and dietitian consulted.  Continue liraglutide and  SSI.  Anxiety and depression  Continue Abilify, duloxetine and Topamax.  Morbid obesity/Body mass index is 60.04 kg/m.  Appears to have seen bariatric surgery in the past.  Outpatient follow-up.  Asymptomatic bacteriuria  Has chronic urinary frequency related to prior radiation.  Does have history of nephrolithiasis and ureteral stent placement.  History of rectal cancer status post diverting colostomy  Reportedly in remission.  Outpatient follow-up.  Anemia  Follow CBC in a.m.  Transient dyspnea  Resolved.  Unclear etiology.?  Related to body habitus and acute illness.  BNP 34.  HS troponins x2.  Chest x-ray without acute findings.  Low index of suspicion for PE.  Mild transaminitis  Unclear etiology.  No GI symptoms.  Outpatient follow-up.  DVT prophylaxis: Lovenox Code Status: Full Family Communication: None at bedside Disposition: Changed to oral antibiotics today.  Anticipate discharge tomorrow.   Consultants:  WOC  Procedures:  None  Antimicrobials:  IV vancomycin and ceftriaxone 8/17>   Subjective: Patient states that she is feeling better.  Legs have improved.  Pain is better than before.    Objective:  Vitals:   05/04/19 0831 05/04/19 2027 05/05/19 0416 05/05/19 0742  BP: (!) 130/59 134/80 128/67 (!) 128/55  Pulse: 80 70   67 72  Resp: 16 18 12 16  Temp: (!) 97.3 F (36.3 C) 97.8 F (36.6 C) 97.7 F (36.5 C) 98.4 F (36.9 C)  TempSrc: Oral  Oral Oral   SpO2: 99% 99% 98% 98%  Weight:      Height:        Examination:  General appearance: Awake alert.  In no distress.  Morbidly obese Resp: Clear to auscultation bilaterally.  Normal effort Cardio: S1-S2 is normal regular.  No S3-S4.  No rubs murmurs or bruit GI: Abdomen is soft.  Nontender nondistended.  Bowel sounds are present normal.  No masses organomegaly Extremities: Unna boots noted both legs Neurologic: Alert and oriented x3.  No focal neurological deficits.    Data Reviewed: I have personally reviewed following labs and imaging studies  CBC: Recent Labs  Lab 05/03/19 1021 05/04/19 0635 05/05/19 0410  WBC 6.5 4.8 5.0  NEUTROABS 4.5  --   --   HGB 12.0 11.7* 11.3*  HCT 39.4 37.3 36.4  MCV 91.8 89.7 89.2  PLT 251 250 246   Basic Metabolic Panel: Recent Labs  Lab 05/03/19 0951 05/03/19 1021 05/04/19 0635  NA 139  --  137  K 3.7  --  3.5  CL 107  --  104  CO2 23  --  23  GLUCOSE 187*  --  172*  BUN 21*  --  17  CREATININE 0.83  --  0.89  CALCIUM 9.1  --  8.3*  MG  --  2.1  --   PHOS  --  3.5  --    Liver Function Tests: Recent Labs  Lab 05/03/19 1021  AST 46*  ALT 72*  ALKPHOS 102  BILITOT 0.4  PROT 6.7  ALBUMIN 3.0*     CBG: Recent Labs  Lab 05/03/19 0856 05/04/19 1206 05/04/19 1651 05/04/19 2143 05/05/19 0704  GLUCAP 179* 244* 156* 184* 152*    Recent Results (from the past 240 hour(s))  SARS Coronavirus 2 (Hospital order, Performed in [place] hospital lab) Nasopharyngeal Nasopharyngeal Swab     Status: None   Collection Time: 05/03/19 10:36 AM   Specimen: Nasopharyngeal Swab  Result Value Ref Range Status   SARS Coronavirus 2 NEGATIVE NEGATIVE Final    Comment: (NOTE) If result is NEGATIVE SARS-CoV-2 target nucleic acids are NOT DETECTED. The SARS-CoV-2 RNA is generally detectable in upper and lower  respiratory specimens during the acute phase of infection. The lowest  concentration of SARS-CoV-2 viral copies this assay can  detect is 250  copies / mL. A negative result does not preclude SARS-CoV-2 infection  and should not be used as the sole basis for treatment or other  patient management decisions.  A negative result may occur with  improper specimen collection / handling, submission of specimen other  than nasopharyngeal swab, presence of viral mutation(s) within the  areas targeted by this assay, and inadequate number of viral copies  (<250 copies / mL). A negative result must be combined with clinical  observations, patient history, and epidemiological information. If result is POSITIVE SARS-CoV-2 target nucleic acids are DETECTED. The SARS-CoV-2 RNA is generally detectable in upper and lower  respiratory specimens dur ing the acute phase of infection.  Positive  results are indicative of active infection with SARS-CoV-2.  Clinical  correlation with patient history and other diagnostic information is  necessary to determine patient infection status.  Positive results do  not rule out bacterial infection or co-infection with other viruses. If result   is PRESUMPTIVE POSTIVE SARS-CoV-2 nucleic acids MAY BE PRESENT.   A presumptive positive result was obtained on the submitted specimen  and confirmed on repeat testing.  While 2019 novel coronavirus  (SARS-CoV-2) nucleic acids may be present in the submitted sample  additional confirmatory testing may be necessary for epidemiological  and / or clinical management purposes  to differentiate between  SARS-CoV-2 and other Sarbecovirus currently known to infect humans.  If clinically indicated additional testing with an alternate test  methodology ([id number]) is advised. The SARS-CoV-2 RNA is generally  detectable in upper and lower respiratory sp ecimens during the acute  phase of infection. The expected result is Negative. Fact Sheet for Patients:  https://www.fda.gov/media/136312/download Fact Sheet for Healthcare Providers:  https://www.fda.gov/media/136313/download This test is not yet approved or cleared by the [place] FDA and has been authorized for detection and/or diagnosis of SARS-CoV-2 by FDA under an Emergency Use Authorization (EUA).  This EUA will remain in effect (meaning this test can be used) for the duration of the COVID-19 declaration under Section 564(b)(1) of the Act, 21 U.S.C. section 360bbb-3(b)(1), unless the authorization is terminated or revoked sooner. Performed at [facility], [address] [place], [place] [address]   Urine Culture     Status: Abnormal   Collection Time: 05/03/19 12:00 PM   Specimen: Urine, Random  Result Value Ref Range Status   Specimen Description URINE, RANDOM  Final   Special Requests   Final    NONE Performed at [facility], [address] [place], [place] [address]    Culture MULTIPLE SPECIES PRESENT, SUGGEST RECOLLECTION (A)  Final   Report Status 05/05/2019 FINAL  Final  Culture, blood (routine x 2)     Status: None (Preliminary result)   Collection Time: 05/03/19 12:14 PM   Specimen: BLOOD LEFT ARM  Result Value Ref Range Status   Specimen Description BLOOD LEFT ARM  Final   Special Requests   Final    BOTTLES DRAWN AEROBIC ONLY Blood Culture adequate volume   Culture   Final    NO GROWTH 2 DAYS Performed at [facility], [address] [place], [place] [address]    Report Status PENDING  Incomplete  Wound or Superficial Culture     Status: None (Preliminary result)   Collection Time: 05/03/19 12:22 PM   Specimen: Wound  Result Value Ref Range Status   Specimen Description WOUND LEG SHIN  Final   Special Requests NONE  Final   Gram Stain   Final    FEW SQUAMOUS EPITHELIAL CELLS PRESENT NO WBC SEEN FEW GRAM POSITIVE COCCI    Culture   Final    FEW STAPHYLOCOCCUS AUREUS CULTURE REINCUBATED FOR BETTER GROWTH Performed at [facility], [address] [place], [place] [address]    Report Status PENDING   Incomplete  Culture, blood (routine x 2)     Status: None (Preliminary result)   Collection Time: 05/03/19  5:43 PM   Specimen: BLOOD RIGHT FOREARM  Result Value Ref Range Status   Specimen Description BLOOD RIGHT FOREARM  Final   Special Requests   Final    BOTTLES DRAWN AEROBIC ONLY Blood Culture results may not be optimal due to an inadequate volume of blood received in culture bottles   Culture   Final    NO GROWTH 2 DAYS Performed at  Hospital Lab, [address] [place], [place] [address]    Report Status PENDING  Incomplete           Radiology Studies: Dg Chest Port 1 View  Result Date: 05/03/2019 CLINICAL DATA:  Weakness EXAM: PORTABLE CHEST 1 VIEW COMPARISON:  None. FINDINGS: Cardiac shadows within normal limits. The lungs are well aerated bilaterally. No focal infiltrate or sizable effusion is seen. No bony abnormality is noted. IMPRESSION: No acute abnormality noted. Electronically Signed   By: Inez Catalina M.D.   On: 05/03/2019 11:58        Scheduled Meds: . ARIPiprazole  5 mg Oral Daily  . chlorthalidone  25 mg Oral Daily  . DULoxetine  30 mg Oral Daily  . enoxaparin (LOVENOX) injection  80 mg Subcutaneous Q24H  . insulin aspart  0-9 Units Subcutaneous TID WC  . irbesartan  300 mg Oral Daily  . liraglutide  1.8 mg Subcutaneous Daily  . metoprolol succinate  25 mg Oral Daily  . potassium chloride  10 mEq Oral Daily  . sodium chloride flush  10-40 mL Intracatheter Q12H  . sodium chloride flush  3 mL Intravenous Q12H  . topiramate  25 mg Oral BID   Continuous Infusions: . cefTRIAXone (ROCEPHIN)  IV 2 g (05/05/19 0902)  . vancomycin 1,500 mg (05/05/19 0226)     LOS: 2 days     Bonnielee Haff,  [facility]  To contact the attending provider between 7A-7P or the covering provider during after hours 7P-7A, please log into the web site [email or web address] and access using universal [place] password for that web site. If you do not have the  password, please call the hospital operator.  05/05/2019, 11:05 AM

## 2019-05-05 NOTE — Progress Notes (Signed)
Nutrition Brief Note  RD consulted for diet education regarding new onset Type 2 Diabetes Mellitus. Pt unavailable during attempted time of visit. Handout "Carbohydrate Counting for People with Diabetes" from the Academy of Nutrition and Dietetics Manual to be placed in discharge instructions. RD to follow up for diet education.  Corrin Parker, MS, RD, LDN Pager # [phone] After hours/ weekend pager # [phone]

## 2019-05-05 NOTE — Plan of Care (Signed)

## 2019-05-06 LAB — GLUCOSE, CAPILLARY
Glucose-Capillary: 160 mg/dL — ABNORMAL HIGH (ref 70–99)
Glucose-Capillary: 176 mg/dL — ABNORMAL HIGH (ref 70–99)

## 2019-05-06 LAB — AEROBIC CULTURE W GRAM STAIN (SUPERFICIAL SPECIMEN)

## 2019-05-06 MED ORDER — BLOOD GLUCOSE MONITOR KIT: PACK | 0 refills | Status: AC

## 2019-05-06 MED ORDER — DOXYCYCLINE HYCLATE 100 MG PO TABS: 100.0000 mg | ORAL_TABLET | Freq: Two times a day (BID) | ORAL | 0 refills | Status: AC

## 2019-05-06 NOTE — Progress Notes (Signed)
Discharge instructions given; Pt is in stable condition;not in respiratory distress.Family member will pick her up at the [employer] entrance.

## 2019-05-06 NOTE — Discharge Instructions (Signed)
Blood Glucose Monitoring, Adult Monitoring your blood sugar (glucose) is an important part of managing your diabetes (diabetes mellitus). Blood glucose monitoring involves checking your blood glucose as often as directed and keeping a record (log) of your results over time. Checking your blood glucose regularly and keeping a blood glucose log can:  Help you and your health care provider adjust your diabetes management plan as needed, including your medicines or insulin.  Help you understand how food, exercise, illnesses, and medicines affect your blood glucose.  Let you know what your blood glucose is at any time. You can quickly find out if you have low blood glucose (hypoglycemia) or high blood glucose (hyperglycemia). Your health care provider will set individualized treatment goals for you. Your goals will be based on your age, other medical conditions you have, and how you respond to diabetes treatment. Generally, the goal of treatment is to maintain the following blood glucose levels:  Before meals (preprandial): 80-130 mg/dL (4.4-7.2 mmol/L).  After meals (postprandial): below 180 mg/dL (10 mmol/L).  A1c level: less than 7%. Supplies needed:  Blood glucose meter.  Test strips for your meter. Each meter has its own strips. You must use the strips that came with your meter.  A needle to prick your finger (lancet). Do not use a lancet more than one time.  A device that holds the lancet (lancing device).  A journal or log book to write down your results. How to check your blood glucose  1. Wash your hands with soap and water. 2. Prick the side of your finger (not the tip) with the lancet. Use a different finger each time. 3. Gently rub the finger until a small drop of blood appears. 4. Follow instructions that come with your meter for inserting the test strip, applying blood to the strip, and using your blood glucose meter. 5. Write down your result and any notes. Some meters  allow you to use areas of your body other than your finger (alternative sites) to test your blood. The most common alternative sites are:  Forearm.  Thigh.  Palm of the hand. If you think you may have hypoglycemia, or if you have a history of not knowing when your blood glucose is getting low (hypoglycemia unawareness), do not use alternative sites. Use your finger instead. Alternative sites may not be as accurate as the fingers, because blood flow is slower in these areas. This means that the result you get may be delayed, and it may be different from the result that you would get from your finger. Follow these instructions at home: Blood glucose log   Every time you check your blood glucose, write down your result. Also write down any notes about things that may be affecting your blood glucose, such as your diet and exercise for the day. This information can help you and your health care provider: ? Look for patterns in your blood glucose over time. ? Adjust your diabetes management plan as needed.  Check if your meter allows you to download your records to a computer. Most glucose meters store a record of glucose readings in the meter. If you have type 1 diabetes:  Check your blood glucose 2 or more times a day.  Also check your blood glucose: ? Before every insulin injection. ? Before and after exercise. ? Before meals. ? 2 hours after a meal. ? Occasionally between 2:00 a.m. and 3:00 a.m., as directed. ? Before potentially dangerous tasks, like driving or using heavy  machinery. ? At bedtime.  You may need to check your blood glucose more often, up to 6-10 times a day, if you: ? Use an insulin pump. ? Need multiple daily injections (MDI). ? Have diabetes that is not well-controlled. ? Are ill. ? Have a history of severe hypoglycemia. ? Have hypoglycemia unawareness. If you have type 2 diabetes:  If you take insulin or other diabetes medicines, check your blood glucose 2 or  more times a day.  If you are on intensive insulin therapy, check your blood glucose 4 or more times a day. Occasionally, you may also need to check between 2:00 a.m. and 3:00 a.m., as directed.  Also check your blood glucose: ? Before and after exercise. ? Before potentially dangerous tasks, like driving or using heavy machinery.  You may need to check your blood glucose more often if: ? Your medicine is being adjusted. ? Your diabetes is not well-controlled. ? You are ill. General tips  Always keep your supplies with you.  If you have questions or need help, all blood glucose meters have a 24-hour "hotline" phone number that you can call. You may also contact your health care provider.  After you use a few boxes of test strips, adjust (calibrate) your blood glucose meter by following instructions that came with your meter. Contact a health care provider if:  Your blood glucose is at or above 240 mg/dL (13.3 mmol/L) for 2 days in a row.  You have been sick or have had a fever for 2 days or longer, and you are not getting better.  You have any of the following problems for more than 6 hours: ? You cannot eat or drink. ? You have nausea or vomiting. ? You have diarrhea. Get help right away if:  Your blood glucose is lower than 54 mg/dL (3 mmol/L).  You become confused or you have trouble thinking clearly.  You have difficulty breathing.  You have moderate or large ketone levels in your urine. Summary  Monitoring your blood sugar (glucose) is an important part of managing your diabetes (diabetes mellitus).  Blood glucose monitoring involves checking your blood glucose as often as directed and keeping a record (log) of your results over time.  Your health care provider will set individualized treatment goals for you. Your goals will be based on your age, other medical conditions you have, and how you respond to diabetes treatment.  Every time you check your blood glucose,  write down your result. Also write down any notes about things that may be affecting your blood glucose, such as your diet and exercise for the day. This information is not intended to replace advice given to you by your health care provider. Make sure you discuss any questions you have with your health care provider. Document Released: 09/05/2003 Document Revised: 06/26/2018 Document Reviewed: 02/12/2016 Elsevier Patient Education  [place].      Cellulitis, Adult  Cellulitis is a skin infection. The infected area is often warm, red, swollen, and sore. It occurs most often in the arms and lower legs. It is very important to get treated for this condition. What are the causes? This condition is caused by bacteria. The bacteria enter through a break in the skin, such as a cut, burn, insect bite, open sore, or crack. What increases the risk? This condition is more likely to occur in people who:  Have a weak body defense system (immune system).  Have open cuts, burns, bites, or scrapes  on the skin.  Are older than 50 years of age.  Have a blood sugar problem (diabetes).  Have a long-lasting (chronic) liver disease (cirrhosis) or kidney disease.  Are very overweight (obese).  Have a skin problem, such as: ? Itchy rash (eczema). ? Slow movement of blood in the veins (venous stasis). ? Fluid buildup below the skin (edema).  Have been treated with high-energy rays (radiation).  Use IV drugs. What are the signs or symptoms? Symptoms of this condition include:  Skin that is: ? Red. ? Streaking. ? Spotting. ? Swollen. ? Sore or painful when you touch it. ? Warm.  A fever.  Chills.  Blisters. How is this diagnosed? This condition is diagnosed based on:  Medical history.  Physical exam.  Blood tests.  Imaging tests. How is this treated? Treatment for this condition may include:  Medicines to treat infections or allergies.  Home care, such  as: ? Rest. ? Placing cold or warm cloths (compresses) on the skin.  Hospital care, if the condition is very bad. Follow these instructions at home: Medicines  Take over-the-counter and prescription medicines only as told by your doctor.  If you were prescribed an antibiotic medicine, take it as told by your doctor. Do not stop taking it even if you start to feel better. General instructions   Drink enough fluid to keep your pee (urine) pale yellow.  Do not touch or rub the infected area.  Raise (elevate) the infected area above the level of your heart while you are sitting or lying down.  Place cold or warm cloths on the area as told by your doctor.  Keep all follow-up visits as told by your doctor. This is important. Contact a doctor if:  You have a fever.  You do not start to get better after 1-2 days of treatment.  Your bone or joint under the infected area starts to hurt after the skin has healed.  Your infection comes back. This can happen in the same area or another area.  You have a swollen bump in the area.  You have new symptoms.  You feel ill and have muscle aches and pains. Get help right away if:  Your symptoms get worse.  You feel very sleepy.  You throw up (vomit) or have watery poop (diarrhea) for a long time.  You see red streaks coming from the area.  Your red area gets larger.  Your red area turns dark in color. These symptoms may represent a serious problem that is an emergency. Do not wait to see if the symptoms will go away. Get medical help right away. Call your local emergency services (911 in the [place] Do not drive yourself to the hospital. Summary  Cellulitis is a skin infection. The area is often warm, red, swollen, and sore.  This condition is treated with medicines, rest, and cold and warm cloths.  Take all medicines only as told by your doctor.  Tell your doctor if symptoms do not start to get better after 1-2 days of  treatment. This information is not intended to replace advice given to you by your health care provider. Make sure you discuss any questions you have with your health care provider. Document Released: 02/19/2008 Document Revised: 01/22/2018 Document Reviewed: 01/22/2018 Elsevier Patient Education  2020 [employer].

## 2019-05-06 NOTE — Discharge Summary (Signed)
[facility]  Physician Discharge Summary   Patient ID: Martha Wood MRN: [id number] DOB/AGE: 24-Mar-1971 48 y.o.  Admit date: 05/03/2019 Discharge date: 05/06/2019  PCP: Patient, No Pcp Per  DISCHARGE DIAGNOSES:  Bilateral lower extremity cellulitis in the setting of lymphedema Essential hypertension Morbid obesity Newly diagnosed diabetes mellitus type 2 Anxiety and depression Asymptomatic bacteriuria    RECOMMENDATIONS FOR OUTPATIENT FOLLOW UP: 1. Patient to follow-up with outpatient providers.    Home Health: None Equipment/Devices: None  CODE STATUS: Full code  DISCHARGE CONDITION: fair  Diet recommendation: Modified carbohydrate  INITIAL HISTORY: 48 year old female, lives with her boyfriend, uses Rollator or cane as needed, PMH of stage III rectal cancer s/p diverting colostomy, neoadjuvant chemoradiation, low anterior resection and adjuvant chemotherapy, said to be in remission, DVT completed anticoagulation course, nephrolithiasis/benign tumor of left ureter, hydronephrosis due to ureteral stricture, s/p left ureteroscopy and stent exchange, chronic bilateral lower extremity lymphedema since her cancer treatment, HTN, morbid obesity, to [facility] ED 8/17 due to couple days history of generally not feeling well, progressively worsening bilateral lower extremity swelling and pain with associated redness over the last 1 to 2 weeks, chills without fevers, to an extent of limiting ambulation.  Admitted for suspected cellulitis complicating bilateral lower extremity lymphedema.  Slowly improving.   Consultations:  None  Procedures:  None   HOSPITAL COURSE:   Bilateral leg cellulitis complicating chronic lymphedema  Denies history of trauma.  Reports that diuretics do not work for her lymphedema.  Lymphedema related to prior cancer surgery and radiation.  Had small blister and a tiny minimally draining ulcer over right shin.  CRP 1.1, minimally elevated  lactate has normalized, ESR 34, HIV antibody screen nonreactive, right shin wound culture shows few Staphylococcus aureus.  Blood cultures x2: Negative.    Patient was started on ceftriaxone and vancomycin.  Unna boots placed.  Patient to remove them tomorrow as instructed.  Patient told to keep legs elevated.  With clinical improvement patient transition to oral doxycycline yesterday.  Will be discharged on the same.  Essential hypertension  Stable.  Continue home medications.  Newly diagnosed type II DM  A1c 6.9. TSH: 3.997.  Counseled patient regarding her new diagnosis.  She was taking Victoza for weight loss, now can continue for DM.   Anxiety and depression  Continue Abilify, duloxetine and Topamax.  Morbid obesity/Body mass index is 60.04 kg/m.  Appears to have seen bariatric surgery in the past.  Outpatient follow-up.  Asymptomatic bacteriuria  Has chronic urinary frequency related to prior radiation.  Does have history of nephrolithiasis and ureteral stent placement.  History of rectal cancer status post diverting colostomy  Reportedly in remission.  Outpatient follow-up.  Anemia  Hemoglobin is stable.  Transient dyspnea  Resolved.  Unclear etiology.?  Related to body habitus and acute illness.   Mild transaminitis  Unclear etiology.  No GI symptoms.  Outpatient follow-up.   Overall stable.  Okay for discharge home today.   PERTINENT LABS:  The results of significant diagnostics from this hospitalization (including imaging, microbiology, ancillary and laboratory) are listed below for reference.    Microbiology: Recent Results (from the past 240 hour(s))  SARS Coronavirus 2 [facility] order, Performed in [facility] hospital lab) Nasopharyngeal Nasopharyngeal Swab     Status: None   Collection Time: 05/03/19 10:36 AM   Specimen: Nasopharyngeal Swab  Result Value Ref Range Status   SARS Coronavirus 2 NEGATIVE NEGATIVE Final     Comment: (NOTE) If result is NEGATIVE SARS-CoV-2  target nucleic acids are NOT DETECTED. The SARS-CoV-2 RNA is generally detectable in upper and lower  respiratory specimens during the acute phase of infection. The lowest  concentration of SARS-CoV-2 viral copies this assay can detect is 250  copies / mL. A negative result does not preclude SARS-CoV-2 infection  and should not be used as the sole basis for treatment or other  patient management decisions.  A negative result may occur with  improper specimen collection / handling, submission of specimen other  than nasopharyngeal swab, presence of viral mutation(s) within the  areas targeted by this assay, and inadequate number of viral copies  (<250 copies / mL). A negative result must be combined with clinical  observations, patient history, and epidemiological information. If result is POSITIVE SARS-CoV-2 target nucleic acids are DETECTED. The SARS-CoV-2 RNA is generally detectable in upper and lower  respiratory specimens dur ing the acute phase of infection.  Positive  results are indicative of active infection with SARS-CoV-2.  Clinical  correlation with patient history and other diagnostic information is  necessary to determine patient infection status.  Positive results do  not rule out bacterial infection or co-infection with other viruses. If result is PRESUMPTIVE POSTIVE SARS-CoV-2 nucleic acids MAY BE PRESENT.   A presumptive positive result was obtained on the submitted specimen  and confirmed on repeat testing.  While 2019 novel coronavirus  (SARS-CoV-2) nucleic acids may be present in the submitted sample  additional confirmatory testing may be necessary for epidemiological  and / or clinical management purposes  to differentiate between  SARS-CoV-2 and other Sarbecovirus currently known to infect humans.  If clinically indicated additional testing with an alternate test  methodology [phone]) is advised. The SARS-CoV-2  RNA is generally  detectable in upper and lower respiratory sp ecimens during the acute  phase of infection. The expected result is Negative. Fact Sheet for Patients:  StrictlyIdeas.no Fact Sheet for Healthcare Providers: BankingDealers.co.za This test is not yet approved or cleared by the [place] FDA and has been authorized for detection and/or diagnosis of SARS-CoV-2 by FDA under an Emergency Use Authorization (EUA).  This EUA will remain in effect (meaning this test can be used) for the duration of the COVID-19 declaration under Section 564(b)(1) of the Act, 21 U.S.C. section 360bbb-3(b)(1), unless the authorization is terminated or revoked sooner. Performed at [facility], [place] [address], [place], [place] [address]   Urine Culture     Status: Abnormal   Collection Time: 05/03/19 12:00 PM   Specimen: Urine, Random  Result Value Ref Range Status   Specimen Description URINE, RANDOM  Final   Special Requests   Final    NONE Performed at [facility], [place] [address] [place], [place] [address]    Culture MULTIPLE SPECIES PRESENT, SUGGEST RECOLLECTION (A)  Final   Report Status 05/05/2019 FINAL  Final  Culture, blood (routine x 2)     Status: None (Preliminary result)   Collection Time: 05/03/19 12:14 PM   Specimen: BLOOD LEFT ARM  Result Value Ref Range Status   Specimen Description BLOOD LEFT ARM  Final   Special Requests   Final    BOTTLES DRAWN AEROBIC ONLY Blood Culture adequate volume   Culture   Final    NO GROWTH 3 DAYS Performed at [facility], [place] [address], [place], [place] [address]    Report Status PENDING  Incomplete  Wound or Superficial Culture     Status: None  Collection Time: 05/03/19 12:22 PM   Specimen: Wound  Result Value Ref Range Status   Specimen Description WOUND LEG SHIN  Final   Special Requests NONE  Final   Gram Stain   Final    FEW SQUAMOUS EPITHELIAL CELLS PRESENT  NO WBC SEEN FEW GRAM POSITIVE COCCI    Culture   Final    FEW STAPHYLOCOCCUS AUREUS FEW GROUP B STREP(S.AGALACTIAE)ISOLATED TESTING AGAINST S. AGALACTIAE NOT ROUTINELY PERFORMED DUE TO PREDICTABILITY OF AMP/PEN/VAN SUSCEPTIBILITY. Performed at [facility], [place] [address] [place], [place] [address]    Report Status 05/06/2019 FINAL  Final   Organism ID, Bacteria STAPHYLOCOCCUS AUREUS  Final      Susceptibility   Staphylococcus aureus - MIC*    CIPROFLOXACIN <=0.5 SENSITIVE Sensitive     ERYTHROMYCIN >=8 RESISTANT Resistant     GENTAMICIN <=0.5 SENSITIVE Sensitive     OXACILLIN <=0.25 SENSITIVE Sensitive     TETRACYCLINE <=1 SENSITIVE Sensitive     VANCOMYCIN 1 SENSITIVE Sensitive     TRIMETH/SULFA <=10 SENSITIVE Sensitive     CLINDAMYCIN <=0.25 SENSITIVE Sensitive     RIFAMPIN <=0.5 SENSITIVE Sensitive     Inducible Clindamycin NEGATIVE Sensitive     * FEW STAPHYLOCOCCUS AUREUS  Culture, blood (routine x 2)     Status: None (Preliminary result)   Collection Time: 05/03/19  5:43 PM   Specimen: BLOOD RIGHT FOREARM  Result Value Ref Range Status   Specimen Description BLOOD RIGHT FOREARM  Final   Special Requests   Final    BOTTLES DRAWN AEROBIC ONLY Blood Culture results may not be optimal due to an inadequate volume of blood received in culture bottles   Culture   Final    NO GROWTH 3 DAYS Performed at [facility], [place] [address], [place], [place] [address]    Report Status PENDING  Incomplete     Labs: Basic Metabolic Panel: Recent Labs  Lab 05/03/19 0951 05/03/19 1021 05/04/19 0635  NA 139  --  137  K 3.7  --  3.5  CL 107  --  104  CO2 23  --  23  GLUCOSE 187*  --  172*  BUN 21*  --  17  CREATININE 0.83  --  0.89  CALCIUM 9.1  --  8.3*  MG  --  2.1  --   PHOS  --  3.5  --    Liver Function Tests: Recent Labs  Lab 05/03/19 1021  AST 46*  ALT 72*  ALKPHOS 102  BILITOT 0.4  PROT 6.7  ALBUMIN 3.0*   CBC: Recent Labs  Lab 05/03/19 1021  05/04/19 0635 05/05/19 0410  WBC 6.5 4.8 5.0  NEUTROABS 4.5  --   --   HGB 12.0 11.7* 11.3*  HCT 39.4 37.3 36.4  MCV 91.8 89.7 89.2  PLT 251 250 246   BNP: BNP (last 3 results) Recent Labs    05/03/19 0951  BNP 33.7     CBG: Recent Labs  Lab 05/05/19 1143 05/05/19 1624 05/05/19 2205 05/06/19 0714 05/06/19 1221  GLUCAP 192* 210* 144* 160* 176*     IMAGING STUDIES Dg Chest Port 1 View  Result Date: 05/03/2019 CLINICAL DATA:  Weakness EXAM: PORTABLE CHEST 1 VIEW COMPARISON:  None. FINDINGS: Cardiac shadows within normal limits. The lungs are well aerated bilaterally. No focal infiltrate or sizable effusion is seen. No bony abnormality is noted. IMPRESSION: No acute abnormality noted. Electronically Signed   By: Inez Catalina  M.D.   On: 05/03/2019 11:58    DISCHARGE EXAMINATION: Vitals:   05/05/19 1435 05/05/19 1823 05/06/19 0501 05/06/19 0912  BP: (!) 123/58 132/75 (!) 140/91 (!) 145/85  Pulse: 72 64 81 75  Resp: _0 Temp: 98.6 F (37 C) 97.6 F (36.4 C) 98.3 F (36.8 C) 97.7 F (36.5 C)  TempSrc: Oral Oral Oral Oral  SpO2: 99% 98% 95% 99%  Weight:      Height:       General appearance: Awake alert.  In no distress.  Morbidly obese Resp: Clear to auscultation bilaterally.  Normal effort Cardio: S1-S2 is normal regular.  No S3-S4.  No rubs murmurs or bruit GI: Abdomen is soft.  Nontender nondistended.  Bowel sounds are present normal.  No masses organomegaly Extremities: Both legs covered with Unna boots Neurologic: Alert and oriented x3.  No focal neurological deficits.    DISPOSITION: Home  Discharge Instructions    Call MD for:  difficulty breathing, headache or visual disturbances   Complete by: As directed    Call MD for:  extreme fatigue   Complete by: As directed    Call MD for:  persistant dizziness or light-headedness   Complete by: As directed    Call MD for:  persistant nausea and vomiting   Complete by: As directed    Call MD  for:  redness, tenderness, or signs of infection (pain, swelling, redness, odor or green/yellow discharge around incision site)   Complete by: As directed    Call MD for:  temperature >100.4   Complete by: As directed    Diet Carb Modified   Complete by: As directed    Discharge instructions   Complete by: As directed    Please be sure to follow-up with your primary care provider in 1 to 2 weeks.  Take your medications as prescribed.  Monitor your blood glucose levels.  You were cared for by a hospitalist during your hospital stay. If you have any questions about your discharge medications or the care you received while you were in the hospital after you are discharged, you can call the unit and asked to speak with the hospitalist on call if the hospitalist that took care of you is not available. Once you are discharged, your primary care physician will handle any further medical issues. Please note that NO REFILLS for any discharge medications will be authorized once you are discharged, as it is imperative that you return to your primary care physician (or establish a relationship with a primary care physician if you do not have one) for your aftercare needs so that they can reassess your need for medications and monitor your lab values. If you do not have a primary care physician, you can call [phone] for a physician referral.   Increase activity slowly   Complete by: As directed         Allergies as of 05/06/2019      Reactions   Diphenhydramine Anaphylaxis, Hives   Codeine Itching, Nausea And Vomiting   Morphine And Related Itching, Nausea And Vomiting   Latex Hives, Other (See Comments)   Powdered gloves      Medication List    TAKE these medications   ARIPiprazole 5 MG tablet Commonly known as: ABILIFY Take 5 mg by mouth daily.   blood glucose meter kit and supplies Kit Dispense based on patient and insurance preference. Use up to four times daily as directed. (FOR ICD-9  250.00, 250.01).  chlorthalidone 25 MG tablet Commonly known as: HYGROTON Take 25 mg by mouth daily.   clonazePAM 0.5 MG tablet Commonly known as: KLONOPIN Take 0.5 mg by mouth daily as needed for anxiety.   doxycycline 100 MG tablet Commonly known as: VIBRA-TABS Take 1 tablet (100 mg total) by mouth every 12 (twelve) hours for 10 days.   DULoxetine 30 MG capsule Commonly known as: CYMBALTA Take 30 mg by mouth daily.   ibuprofen 800 MG tablet Commonly known as: ADVIL Take 800 mg by mouth every 8 (eight) hours as needed for mild pain.   meloxicam 15 MG tablet Commonly known as: MOBIC Take 15 mg by mouth daily.   metoprolol succinate 25 MG 24 hr tablet Commonly known as: TOPROL-XL Take 25 mg by mouth daily.   potassium chloride 10 MEQ tablet Commonly known as: K-DUR Take 10 mEq by mouth daily.   telmisartan 80 MG tablet Commonly known as: MICARDIS Take 80 mg by mouth daily.   topiramate 25 MG tablet Commonly known as: TOPAMAX Take 25 mg by mouth 2 (two) times daily.   VESIcare 10 MG tablet Generic drug: solifenacin Take 10 mg by mouth daily.   Victoza 18 MG/3ML Sopn Generic drug: liraglutide Inject 1.8 mg into the skin daily.          TOTAL DISCHARGE TIME: 35 minutes  Quang Thorpe [employer] on [email or web address]  05/06/2019, 1:06 PM

## 2019-05-08 LAB — CULTURE, BLOOD (ROUTINE X 2)
Culture: NO GROWTH
Culture: NO GROWTH
Special Requests: ADEQUATE

## 2019-05-20 ENCOUNTER — Encounter (HOSPITAL_COMMUNITY): Payer: Self-pay

## 2019-05-20 ENCOUNTER — Emergency Department (HOSPITAL_COMMUNITY)
Admission: EM | Admit: 2019-05-20 | Discharge: 2019-05-21 | Disposition: A | Discharge status: Home / Self Care | Payer: Medicaid Other | Attending: Emergency Medicine | Admitting: Emergency Medicine

## 2019-05-20 DIAGNOSIS — Z91040 Latex allergy status: Secondary | ICD-10-CM | POA: Insufficient documentation

## 2019-05-20 DIAGNOSIS — Z7984 Long term (current) use of oral hypoglycemic drugs: Secondary | ICD-10-CM | POA: Insufficient documentation

## 2019-05-20 DIAGNOSIS — Z87891 Personal history of nicotine dependence: Secondary | ICD-10-CM | POA: Insufficient documentation

## 2019-05-20 DIAGNOSIS — N3000 Acute cystitis without hematuria: Secondary | ICD-10-CM

## 2019-05-20 DIAGNOSIS — Z85038 Personal history of other malignant neoplasm of large intestine: Secondary | ICD-10-CM | POA: Diagnosis not present

## 2019-05-20 DIAGNOSIS — Z79899 Other long term (current) drug therapy: Secondary | ICD-10-CM | POA: Diagnosis not present

## 2019-05-20 DIAGNOSIS — I10 Essential (primary) hypertension: Secondary | ICD-10-CM | POA: Diagnosis not present

## 2019-05-20 DIAGNOSIS — R739 Hyperglycemia, unspecified: Secondary | ICD-10-CM

## 2019-05-20 DIAGNOSIS — E1165 Type 2 diabetes mellitus with hyperglycemia: Secondary | ICD-10-CM | POA: Diagnosis present

## 2019-05-20 HISTORY — DX: Type 2 diabetes mellitus without complications: E11.9

## 2019-05-20 LAB — BASIC METABOLIC PANEL
Anion gap: 10 (ref 5–15)
BUN: 20 mg/dL (ref 6–20)
CO2: 23 mmol/L (ref 22–32)
Calcium: 8.2 mg/dL — ABNORMAL LOW (ref 8.9–10.3)
Chloride: 104 mmol/L (ref 98–111)
Creatinine, Ser: 1.01 mg/dL — ABNORMAL HIGH (ref 0.44–1.00)
GFR calc Af Amer: >60 mL/min (ref >60)
GFR calc non Af Amer: >60 mL/min (ref >60)
Glucose, Bld: 350 mg/dL — ABNORMAL HIGH (ref 70–99)
Potassium: 3.6 mmol/L (ref 3.5–5.1)
Sodium: 137 mmol/L (ref 135–145)

## 2019-05-20 LAB — CBC
HCT: 39.1 % (ref 36.0–46.0)
Hemoglobin: 11.7 g/dL — ABNORMAL LOW (ref 12.0–15.0)
MCH: 27.3 pg (ref 26.0–34.0)
MCHC: 29.9 g/dL — ABNORMAL LOW (ref 30.0–36.0)
MCV: 91.4 fL (ref 80.0–100.0)
Platelets: 260 10*3/uL (ref 150–400)
RBC: 4.28 MIL/uL (ref 3.87–5.11)
RDW: 18.2 % — ABNORMAL HIGH (ref 11.5–15.5)
WBC: 6.9 10*3/uL (ref 4.0–10.5)
nRBC: 0 % (ref 0.0–0.2)

## 2019-05-20 LAB — URINALYSIS, ROUTINE W REFLEX MICROSCOPIC
Bilirubin Urine: NEGATIVE
Glucose, UA: >=500 mg/dL — AB
Ketones, ur: NEGATIVE mg/dL
Nitrite: POSITIVE — AB
Protein, ur: NEGATIVE mg/dL
Specific Gravity, Urine: 1.027 (ref 1.005–1.030)
WBC, UA: >50 WBC/hpf — ABNORMAL HIGH (ref 0–5)
pH: 6 (ref 5.0–8.0)

## 2019-05-20 LAB — CBG MONITORING, ED: Glucose-Capillary: 396 mg/dL — ABNORMAL HIGH (ref 70–99)

## 2019-05-20 MED ORDER — ONDANSETRON HCL 4 MG/2ML IJ SOLN
4.0000 mg | Freq: Once | INTRAMUSCULAR | Status: AC
  Administered 2019-05-20: 4 mg via INTRAVENOUS
  Filled 2019-05-20: qty 2

## 2019-05-20 MED ORDER — SODIUM CHLORIDE 0.9 % IV BOLUS
1000.0000 mL | Freq: Once | INTRAVENOUS | Status: AC
  Administered 2019-05-20: 1000 mL via INTRAVENOUS

## 2019-05-20 NOTE — ED Provider Notes (Addendum)
[facility] EMERGENCY DEPARTMENT Provider Note   CSN: [id number] Arrival date & time: 05/20/19  2236     History   Chief Complaint Chief Complaint  Patient presents with   Hyperglycemia    HPI Martha Wood is a 48 y.o. female with a history of morbid obesity, diabetes mellitus type 2, colon CA, bilateral lower extremity lymphedema, kidney stones who presents to the emergency department with a chief complaint of hyperglycemia.  The patient reports that she checked her blood sugar at approximately 2130 at home with a reading of 473.  She reports that shortly before checking her blood sugar that she suddenly began feeling very nauseated, lightheaded, dizzy, generally weak, with blurred vision.  She also endorses polyuria and polydipsia.  She reports that she has been compliant with her home Victoza and glipizide with no missed doses.  Earlier today, her blood sugar was running in the low 100s.  She does report that she had approximately 4 episodes of nonbloody diarrhea earlier today.  She reports at baseline she has approximately 1 episode of loose stools daily.  She also notes that she has been having dysuria and urinary urgency for the last few days as well as chills today.   She denies headache, neck pain, fever, cough, chest pain, shortness of breath, abdominal pain, constipation, numbness, vaginal pain or bleeding, rash.  Patient was recently admitted from August 17 through the 20th for lactic acidosis and cellulitis of the lower legs.  She was discharged home with doxycycline, which she finished last week.     The history is provided by the patient. No language interpreter was used.    Past Medical History:  Diagnosis Date   Cancer [facility])    Colon cancer ([place])    Diabetes mellitus without complication ([place])    Lymphedema    Obesity    Pneumonia     Patient Active Problem List   Diagnosis Date Noted   Cellulitis of leg 05/03/2019   Lactic  acidosis 05/03/2019   Hyperglycemia 05/03/2019   Depression 05/03/2019   History of colon cancer 05/03/2019   Lymphedema 05/03/2019    Past Surgical History:  Procedure Laterality Date   ABDOMINAL HYSTERECTOMY     CESAREAN SECTION     COLON SURGERY     HERNIA REPAIR       OB History   No obstetric history on file.      Home Medications    Prior to Admission medications   Medication Sig Start Date End Date Taking? Authorizing Provider  ARIPiprazole (ABILIFY) 5 MG tablet Take 5 mg by mouth daily. 04/22/19  Yes [provider]  chlorthalidone (HYGROTON) 25 MG tablet Take 25 mg by mouth daily. 04/22/19  Yes [provider]  clonazePAM (KLONOPIN) 0.5 MG tablet Take 0.5 mg by mouth daily as needed for anxiety.  04/22/19  Yes [provider]  DULoxetine (CYMBALTA) 30 MG capsule Take 30 mg by mouth daily. 04/22/19  Yes [provider]  glipiZIDE (GLUCOTROL XL) 10 MG 24 hr tablet Take 10 mg by mouth daily. 05/13/19  Yes [provider]  ibuprofen (ADVIL) 800 MG tablet Take 800 mg by mouth every 8 (eight) hours as needed for mild pain.   Yes [provider]  meloxicam (MOBIC) 15 MG tablet Take 15 mg by mouth daily. 04/22/19  Yes [provider]  metoprolol succinate (TOPROL-XL) 25 MG 24 hr tablet Take 25 mg by mouth daily. 04/22/19  Yes [provider]  potassium chloride (K-DUR) 10 MEQ tablet Take 10 mEq by mouth daily. 04/22/19  Yes [provider]  telmisartan (MICARDIS) 80 MG tablet Take 80 mg by mouth daily. 04/22/19  Yes [provider]  topiramate (TOPAMAX) 25 MG tablet Take 25 mg by mouth 2 (two) times daily. 02/18/19  Yes [provider]  VESICARE 10 MG tablet Take 10 mg by mouth daily. 04/22/19  Yes [provider]  VICTOZA 18 MG/3ML SOPN Inject 1.8 mg into the skin daily. 03/22/19  Yes [provider]  blood glucose meter kit and supplies KIT Dispense based on patient and  insurance preference. Use up to four times daily as directed. (FOR ICD-9 250.00, 250.01). 05/06/19   Bonnielee Haff, MD  cephALEXin (KEFLEX) 500 MG capsule Take 1 capsule (500 mg total) by mouth 4 (four) times daily for 7 days. 05/21/19 05/28/19  Lorynn Moeser, Laymond Purser, PA-C    Family History Family History  Problem Relation Age of Onset   Hypertension Mother    High Cholesterol Mother    Healthy Father    High Cholesterol Father     Social History Social History   Tobacco Use   Smoking status: Former Smoker    Packs/day: 0.50    Types: Cigarettes    Quit date: 2017    Years since quitting: 3.6   Smokeless tobacco: Never Used  Substance Use Topics   Alcohol use: No   Drug use: Yes    Types: Marijuana     Allergies   Diphenhydramine, Codeine, Morphine and related, and Latex   Review of Systems Review of Systems  Constitutional: Positive for chills. Negative for activity change and fever.  HENT: Negative for congestion, drooling and sore throat.   Respiratory: Negative for shortness of breath and wheezing.   Cardiovascular: Positive for leg swelling (chronic). Negative for chest pain and palpitations.  Gastrointestinal: Positive for nausea. Negative for abdominal pain, anal bleeding, blood in stool, constipation, diarrhea and vomiting.  Genitourinary: Positive for dysuria and urgency. Negative for flank pain and hematuria.  Musculoskeletal: Negative for back pain, myalgias, neck pain and neck stiffness.  Skin: Negative for rash.  Allergic/Immunologic: Negative for immunocompromised state.  Neurological: Positive for dizziness, weakness (generalized) and light-headedness. Negative for headaches.  Psychiatric/Behavioral: Negative for confusion.     Physical Exam Updated Vital Signs BP 118/66    Pulse 91    Temp 98.4 F (36.9 C) (Oral)    Resp (!) 21    LMP 05/30/2014    SpO2 99%   Physical Exam Vitals signs and nursing note reviewed.  Constitutional:      General:  She is not in acute distress.    Appearance: She is obese.     Comments: Morbidly obese  HENT:     Head: Normocephalic.  Eyes:     Conjunctiva/sclera: Conjunctivae normal.  Neck:     Musculoskeletal: Neck supple.  Cardiovascular:     Rate and Rhythm: Normal rate and regular rhythm.     Pulses: Normal pulses.     Heart sounds: Normal heart sounds. No murmur. No friction rub. No gallop.   Pulmonary:     Effort: Pulmonary effort is normal. No respiratory distress.     Breath sounds: No stridor. No wheezing, rhonchi or rales.  Chest:     Chest wall: No tenderness.  Abdominal:     General: There is no distension.     Palpations: Abdomen is soft. There is no mass.     Tenderness:  There is no abdominal tenderness. There is no right CVA tenderness, left CVA tenderness, guarding or rebound.     Hernia: No hernia is present.     Comments: Abdomen is soft, nontender, nondistended.  No CVA tenderness bilaterally.  Musculoskeletal:     Right lower leg: Edema (Chronic appearing) present.     Left lower leg: Edema (Chronic appearing) present.  Skin:    General: Skin is warm.     Findings: No rash.  Neurological:     Mental Status: She is alert.  Psychiatric:        Behavior: Behavior normal.    ED Treatments / Results  Labs (all labs ordered are listed, but only abnormal results are displayed) Labs Reviewed  BASIC METABOLIC PANEL - Abnormal; Notable for the following components:      Result Value   Glucose, Bld 350 (*)    Creatinine, Ser 1.01 (*)    Calcium 8.2 (*)    All other components within normal limits  CBC - Abnormal; Notable for the following components:   Hemoglobin 11.7 (*)    MCHC 29.9 (*)    RDW 18.2 (*)    All other components within normal limits  URINALYSIS, ROUTINE W REFLEX MICROSCOPIC - Abnormal; Notable for the following components:   Color, Urine STRAW (*)    APPearance HAZY (*)    Glucose, UA >=500 (*)    Hgb urine dipstick SMALL (*)    Nitrite POSITIVE  (*)    Leukocytes,Ua MODERATE (*)    WBC, UA >50 (*)    Bacteria, UA MANY (*)    All other components within normal limits  CBG MONITORING, ED - Abnormal; Notable for the following components:   Glucose-Capillary 396 (*)    All other components within normal limits  CBG MONITORING, ED - Abnormal; Notable for the following components:   Glucose-Capillary 222 (*)    All other components within normal limits  URINE CULTURE  I-STAT BETA HCG BLOOD, ED ([facility], [facility], [facility] ONLY)    EKG EKG Interpretation  Date/Time:  Thursday May 20 2019 22:53:59 EDT Ventricular Rate:  102 PR Interval:    QRS Duration: 102 QT Interval:  320 QTC Calculation: 417 R Axis:   67 Text Interpretation:  Sinus tachycardia Borderline repolarization abnormality When compared with ECG of 05/03/2019, No significant change was found Confirmed by Delora Fuel ([id number]) on 05/21/2019 12:40:14 AM   Radiology No results found.  Procedures Procedures (including critical care time)  Medications Ordered in ED Medications  sodium chloride 0.9 % bolus 1,000 mL (0 mLs Intravenous Stopped 05/21/19 0006)  ondansetron (ZOFRAN) injection 4 mg (4 mg Intravenous Given 05/20/19 2313)  sodium chloride 0.9 % bolus 1,000 mL (0 mLs Intravenous Stopped 05/21/19 0127)  cefTRIAXone (ROCEPHIN) 1 g in sodium chloride 0.9 % 100 mL IVPB (0 g Intravenous Stopped 05/21/19 0127)     Initial Impression / Assessment and Plan / ED Course  I have reviewed the triage vital signs and the nursing notes.  Pertinent labs & imaging results that were available during my care of the patient were reviewed by me and considered in my medical decision making (see chart for details).  Clinical Course as of May 21 143  Fri May 21, 2019  0130 Patient recheck.  She states I feel amazing, so much better.  And ready to go home.  She reports that she has a follow-up appointment with her primary care provider on Monday, September 7, which I encouraged her  to keep.   [MM]     Clinical Course User Index [MM] Kaileb Monsanto A, PA-C       48 year old female with a history of morbid obesity, diabetes mellitus type 2, colon CA, bilateral lower extremity lymphedema, kidney stones presenting by EMS from home with hyperglycemia.  She was given 1 unit of insulin in route with EMS after her CBG was found to be 500.  Patient was independently evaluated by Dr. Roxanne Mins. She is endorsing generalized weakness, dizziness, lightheadedness, blurred vision that began suddenly around the time when her blood sugar was found be elevated at home tonight.  I suspect the symptoms are secondary to hyperglycemia as the patient was treated with 2 L of IV fluids in the ER after her blood sugar was found to be 350.  Bicarb and anion gap were normal.  Low suspicion for DKA or HHS.  Repeat CBG was 222.  Labs are also notable for a UTI.  Urine culture sent.  She was given a dose of IV Rocephin in the ER and I will discharge her on Keflex.  She has a follow-up appointment with her primary care provider in 3 days, which I encouraged her to keep.  Suspect hyperglycemia may be secondary to underlying UTI.  At this time, she is asymptomatic.  I doubt urosepsis or obstructive uropathy at this time.  ER return precautions given.  She is hemodynamically stable.  She is feeling much better and is ready for discharge.   Final Clinical Impressions(s) / ED Diagnoses   Final diagnoses:  Hyperglycemia  Acute cystitis without hematuria    ED Discharge Orders         Ordered    cephALEXin (KEFLEX) 500 MG capsule  4 times daily     05/21/19 0133           Shadara Lopez, Maree Erie A, PA-C 05/21/19 0143    Briasia Flinders A, PA-C 16/10/96 0454    Delora Fuel, MD 09/81/19 1478    Delora Fuel, MD 29/56/21 [phone]

## 2019-05-20 NOTE — ED Triage Notes (Signed)
Pt comes via [facility] for hyperglycemia, new onset of diabetes, CBG 500 with EMS pt took one unit of insulin.

## 2019-05-21 LAB — CBG MONITORING, ED: Glucose-Capillary: 222 mg/dL — ABNORMAL HIGH (ref 70–99)

## 2019-05-21 LAB — I-STAT BETA HCG BLOOD, ED (MC, WL, AP ONLY): I-stat hCG, quantitative: <5 m[IU]/mL (ref <5)

## 2019-05-21 MED ORDER — SODIUM CHLORIDE 0.9 % IV BOLUS
1000.0000 mL | Freq: Once | INTRAVENOUS | Status: AC
  Administered 2019-05-21: 1000 mL via INTRAVENOUS

## 2019-05-21 MED ORDER — SODIUM CHLORIDE 0.9 % IV SOLN
1.0000 g | Freq: Once | INTRAVENOUS | Status: AC
  Administered 2019-05-21: 1 g via INTRAVENOUS
  Filled 2019-05-21: qty 10

## 2019-05-21 MED ORDER — CEPHALEXIN 500 MG PO CAPS: 500.0000 mg | ORAL_CAPSULE | Freq: Four times a day (QID) | ORAL | 0 refills | Status: AC

## 2019-05-21 NOTE — Discharge Instructions (Signed)
Thank you for allowing me to care for you today in the Emergency Department.   Your blood sugar was high tonight, which was treated with insulin by EMS and IV fluids in the ER.  You have a UTI.  You were given 1 dose of IV antibiotics in the ER, and I have called in a prescription of Keflex to your pharmacy.  Starting tomorrow, take 1 tablet of Keflex every 6 hours by mouth for the next week.  Make sure to take the entire course of antibiotics to prevent antibiotic resistance.  Keep your follow-up appointment with primary care on Monday since you are a new diabetic to make sure that you do not need any adjustments in your medications.  Return to the emergency department if you develop high blood sugars, frequent thirst or urination, severe dizziness or visual changes, severe abdominal pain or vomiting, or other new, concerning symptoms.

## 2019-05-22 LAB — URINE CULTURE: Culture: >=100000 — AB

## 2019-05-23 ENCOUNTER — Telehealth: Payer: Self-pay

## 2019-05-23 ENCOUNTER — Telehealth (HOSPITAL_COMMUNITY): Payer: Self-pay | Admitting: Pharmacist

## 2019-05-23 NOTE — Telephone Encounter (Signed)
Post ED Visit - Positive Culture Follow-up: Successful Patient Follow-Up  Culture assessed and recommendations reviewed by:  []  Elenor Quinones, Pharm.D. []  [name], Pharm.D., BCPS AQ-ID []  Parks Neptune, Pharm.D., BCPS []  [name], Pharm.D., BCPS []  [place], Pharm.D., BCPS, AAHIVP []  [name], Pharm.D., BCPS, AAHIVP []  Salome Arnt, PharmD, BCPS []  Johnnette Gourd, PharmD, BCPS [x]  Hughes Better, PharmD, BCPS []  Leeroy Cha, PharmD  Positive urine culture  []  Patient discharged without antimicrobial prescription and treatment is now indicated [x]  Organism is resistant to prescribed ED discharge antimicrobial []  Patient with positive blood cultures  Changes discussed with ED provider: Nuala Alpha PA New antibiotic prescription Microbid 164md BID  X 5 days Called to [facility] [phone]  Contacted patient, date 05/23/2019, time [place], [place] Comment 05/23/2019, 10:56 AM

## 2019-05-23 NOTE — Progress Notes (Signed)
ED Antimicrobial Stewardship Positive Culture Follow Up   Martha Wood is an 48 y.o. female who presented to [facility] on (Not on file) with a chief complaint of No chief complaint on file.   Recent Results (from the past 720 hour(s))  SARS Coronavirus 2 [facility] order, Performed in [facility] hospital lab) Nasopharyngeal Nasopharyngeal Swab     Status: None   Collection Time: 05/03/19 10:36 AM   Specimen: Nasopharyngeal Swab  Result Value Ref Range Status   SARS Coronavirus 2 NEGATIVE NEGATIVE Final    Comment: (NOTE) If result is NEGATIVE SARS-CoV-2 target nucleic acids are NOT DETECTED. The SARS-CoV-2 RNA is generally detectable in upper and lower  respiratory specimens during the acute phase of infection. The lowest  concentration of SARS-CoV-2 viral copies this assay can detect is 250  copies / mL. A negative result does not preclude SARS-CoV-2 infection  and should not be used as the sole basis for treatment or other  patient management decisions.  A negative result may occur with  improper specimen collection / handling, submission of specimen other  than nasopharyngeal swab, presence of viral mutation(s) within the  areas targeted by this assay, and inadequate number of viral copies  (<250 copies / mL). A negative result must be combined with clinical  observations, patient history, and epidemiological information. If result is POSITIVE SARS-CoV-2 target nucleic acids are DETECTED. The SARS-CoV-2 RNA is generally detectable in upper and lower  respiratory specimens dur ing the acute phase of infection.  Positive  results are indicative of active infection with SARS-CoV-2.  Clinical  correlation with patient history and other diagnostic information is  necessary to determine patient infection status.  Positive results do  not rule out bacterial infection or co-infection with other viruses. If result is PRESUMPTIVE POSTIVE SARS-CoV-2 nucleic acids MAY BE PRESENT.   A  presumptive positive result was obtained on the submitted specimen  and confirmed on repeat testing.  While 2019 novel coronavirus  (SARS-CoV-2) nucleic acids may be present in the submitted sample  additional confirmatory testing may be necessary for epidemiological  and / or clinical management purposes  to differentiate between  SARS-CoV-2 and other Sarbecovirus currently known to infect humans.  If clinically indicated additional testing with an alternate test  methodology [phone]) is advised. The SARS-CoV-2 RNA is generally  detectable in upper and lower respiratory sp ecimens during the acute  phase of infection. The expected result is Negative. Fact Sheet for Patients:  StrictlyIdeas.no Fact Sheet for Healthcare Providers: BankingDealers.co.za This test is not yet approved or cleared by the [place] FDA and has been authorized for detection and/or diagnosis of SARS-CoV-2 by FDA under an Emergency Use Authorization (EUA).  This EUA will remain in effect (meaning this test can be used) for the duration of the COVID-19 declaration under Section 564(b)(1) of the Act, 21 U.S.C. section 360bbb-3(b)(1), unless the authorization is terminated or revoked sooner. Performed at [facility], [place] [address], [place], [place] [address]   Urine Culture     Status: Abnormal   Collection Time: 05/03/19 12:00 PM   Specimen: Urine, Random  Result Value Ref Range Status   Specimen Description URINE, RANDOM  Final   Special Requests   Final    NONE Performed at [facility], [place] [address], [place], [place] [address]    Culture MULTIPLE SPECIES PRESENT, SUGGEST RECOLLECTION (A)  Final   Report Status 05/05/2019 FINAL  Final  Culture, blood (routine x  2)     Status: None   Collection Time: 05/03/19 12:14 PM   Specimen: BLOOD LEFT ARM  Result Value Ref Range Status   Specimen Description BLOOD LEFT ARM  Final   Special Requests    Final    BOTTLES DRAWN AEROBIC ONLY Blood Culture adequate volume   Culture   Final    NO GROWTH 5 DAYS Performed at [facility], [address] [place], [place] [address]    Report Status 05/08/2019 FINAL  Final  Wound or Superficial Culture     Status: None   Collection Time: 05/03/19 12:22 PM   Specimen: Wound  Result Value Ref Range Status   Specimen Description WOUND LEG SHIN  Final   Special Requests NONE  Final   Gram Stain   Final    FEW SQUAMOUS EPITHELIAL CELLS PRESENT NO WBC SEEN FEW GRAM POSITIVE COCCI    Culture   Final    FEW STAPHYLOCOCCUS AUREUS FEW GROUP B STREP(S.AGALACTIAE)ISOLATED TESTING AGAINST S. AGALACTIAE NOT ROUTINELY PERFORMED DUE TO PREDICTABILITY OF AMP/PEN/VAN SUSCEPTIBILITY. Performed at [facility], [place] [address], [place], [place] [address]    Report Status 05/06/2019 FINAL  Final   Organism ID, Bacteria STAPHYLOCOCCUS AUREUS  Final      Susceptibility   Staphylococcus aureus - MIC*    CIPROFLOXACIN <=0.5 SENSITIVE Sensitive     ERYTHROMYCIN >=8 RESISTANT Resistant     GENTAMICIN <=0.5 SENSITIVE Sensitive     OXACILLIN <=0.25 SENSITIVE Sensitive     TETRACYCLINE <=1 SENSITIVE Sensitive     VANCOMYCIN 1 SENSITIVE Sensitive     TRIMETH/SULFA <=10 SENSITIVE Sensitive     CLINDAMYCIN <=0.25 SENSITIVE Sensitive     RIFAMPIN <=0.5 SENSITIVE Sensitive     Inducible Clindamycin NEGATIVE Sensitive     * FEW STAPHYLOCOCCUS AUREUS  Culture, blood (routine x 2)     Status: None   Collection Time: 05/03/19  5:43 PM   Specimen: BLOOD RIGHT FOREARM  Result Value Ref Range Status   Specimen Description BLOOD RIGHT FOREARM  Final   Special Requests   Final    BOTTLES DRAWN AEROBIC ONLY Blood Culture results may not be optimal due to an inadequate volume of blood received in culture bottles   Culture   Final    NO GROWTH 5 DAYS Performed at [facility], [place] [address], [place], [place] [address]    Report Status 05/08/2019 FINAL   Final  Urine culture     Status: Abnormal   Collection Time: 05/20/19 10:57 PM   Specimen: Urine, Random  Result Value Ref Range Status   Specimen Description URINE, RANDOM  Final   Special Requests   Final    NONE Performed at [facility], [place] [address] [place],  [address]    Culture (A)  Final    >=100,000 COLONIES/mL ESCHERICHIA COLI Confirmed Extended Spectrum Beta-Lactamase Producer (ESBL).  In bloodstream infections from ESBL organisms, carbapenems are preferred over piperacillin/tazobactam. They are shown to have a lower risk of mortality.    Report Status 05/22/2019 FINAL  Final   Organism ID, Bacteria ESCHERICHIA COLI (A)  Final      Susceptibility   Escherichia coli - MIC*    AMPICILLIN >=32 RESISTANT Resistant     CEFAZOLIN >=64 RESISTANT Resistant     CEFTRIAXONE RESISTANT Resistant     CIPROFLOXACIN >=4 RESISTANT Resistant     GENTAMICIN 2 SENSITIVE Sensitive     IMIPENEM <=0.25  SENSITIVE Sensitive     NITROFURANTOIN <=16 SENSITIVE Sensitive     TRIMETH/SULFA >=320 RESISTANT Resistant     AMPICILLIN/SULBACTAM >=32 RESISTANT Resistant     PIP/TAZO <=4 SENSITIVE Sensitive     Extended ESBL POSITIVE Resistant     * >=100,000 COLONIES/mL ESCHERICHIA COLI    [x]  Treated with Cephalexin, organism resistant to prescribed antimicrobial []  Patient discharged originally without antimicrobial agent and treatment is now indicated  New antibiotic prescription: Nitrofurantoin 100 mg po bid x 5 days  ED Provider: B. Durward Parcel   Harvel Quale 05/23/2019, 9:46 AM Clinical Pharmacist Monday - Friday phone -  [phone] Saturday - Sunday phone - [phone]

## 2019-06-02 ENCOUNTER — Emergency Department (HOSPITAL_COMMUNITY)
Admission: EM | Admit: 2019-06-02 | Discharge: 2019-06-02 | Disposition: A | Discharge status: Home / Self Care | Payer: Medicaid Other | Attending: Emergency Medicine | Admitting: Emergency Medicine

## 2019-06-02 ENCOUNTER — Other Ambulatory Visit: Payer: Self-pay

## 2019-06-02 DIAGNOSIS — R209 Unspecified disturbances of skin sensation: Secondary | ICD-10-CM | POA: Diagnosis present

## 2019-06-02 DIAGNOSIS — Z5321 Procedure and treatment not carried out due to patient leaving prior to being seen by health care provider: Secondary | ICD-10-CM | POA: Diagnosis not present

## 2019-06-02 LAB — CBC WITH DIFFERENTIAL/PLATELET
Abs Immature Granulocytes: 0.12 10*3/uL — ABNORMAL HIGH (ref 0.00–0.07)
Basophils Absolute: 0.1 10*3/uL (ref 0.0–0.1)
Basophils Relative: 1 %
Eosinophils Absolute: 0.3 10*3/uL (ref 0.0–0.5)
Eosinophils Relative: 4 %
HCT: 41.9 % (ref 36.0–46.0)
Hemoglobin: 12.8 g/dL (ref 12.0–15.0)
Immature Granulocytes: 2 %
Lymphocytes Relative: 11 %
Lymphs Abs: 0.7 10*3/uL (ref 0.7–4.0)
MCH: 28.4 pg (ref 26.0–34.0)
MCHC: 30.5 g/dL (ref 30.0–36.0)
MCV: 92.9 fL (ref 80.0–100.0)
Monocytes Absolute: 0.5 10*3/uL (ref 0.1–1.0)
Monocytes Relative: 7 %
Neutro Abs: 5.3 10*3/uL (ref 1.7–7.7)
Neutrophils Relative %: 75 %
Platelets: 230 10*3/uL (ref 150–400)
RBC: 4.51 MIL/uL (ref 3.87–5.11)
RDW: 18.7 % — ABNORMAL HIGH (ref 11.5–15.5)
WBC: 6.9 10*3/uL (ref 4.0–10.5)
nRBC: 0 % (ref 0.0–0.2)

## 2019-06-02 LAB — COMPREHENSIVE METABOLIC PANEL
ALT: 53 U/L — ABNORMAL HIGH (ref 0–44)
AST: 53 U/L — ABNORMAL HIGH (ref 15–41)
Albumin: 3.1 g/dL — ABNORMAL LOW (ref 3.5–5.0)
Alkaline Phosphatase: 86 U/L (ref 38–126)
Anion gap: 9 (ref 5–15)
BUN: 29 mg/dL — ABNORMAL HIGH (ref 6–20)
CO2: 22 mmol/L (ref 22–32)
Calcium: 8.6 mg/dL — ABNORMAL LOW (ref 8.9–10.3)
Chloride: 110 mmol/L (ref 98–111)
Creatinine, Ser: 0.97 mg/dL (ref 0.44–1.00)
GFR calc Af Amer: >60 mL/min (ref >60)
GFR calc non Af Amer: >60 mL/min (ref >60)
Glucose, Bld: 201 mg/dL — ABNORMAL HIGH (ref 70–99)
Potassium: 5.1 mmol/L (ref 3.5–5.1)
Sodium: 141 mmol/L (ref 135–145)
Total Bilirubin: 1.3 mg/dL — ABNORMAL HIGH (ref 0.3–1.2)
Total Protein: 6.4 g/dL — ABNORMAL LOW (ref 6.5–8.1)

## 2019-06-02 NOTE — ED Triage Notes (Signed)
Pt comes in via [facility] with possible cellulitis, unable to feel her feet and shes "sedeping."  BP 116/82, HR 80, RR 18 CBG 260

## 2019-06-04 ENCOUNTER — Encounter (HOSPITAL_COMMUNITY): Payer: Self-pay

## 2019-06-04 ENCOUNTER — Emergency Department (HOSPITAL_COMMUNITY)
Admission: EM | Admit: 2019-06-04 | Discharge: 2019-06-04 | Disposition: A | Discharge status: Home / Self Care | Payer: Medicaid Other | Attending: Emergency Medicine | Admitting: Emergency Medicine

## 2019-06-04 DIAGNOSIS — L03115 Cellulitis of right lower limb: Secondary | ICD-10-CM | POA: Diagnosis not present

## 2019-06-04 DIAGNOSIS — Z7984 Long term (current) use of oral hypoglycemic drugs: Secondary | ICD-10-CM | POA: Insufficient documentation

## 2019-06-04 DIAGNOSIS — Z79899 Other long term (current) drug therapy: Secondary | ICD-10-CM | POA: Diagnosis not present

## 2019-06-04 DIAGNOSIS — E119 Type 2 diabetes mellitus without complications: Secondary | ICD-10-CM | POA: Insufficient documentation

## 2019-06-04 DIAGNOSIS — Z91040 Latex allergy status: Secondary | ICD-10-CM | POA: Diagnosis not present

## 2019-06-04 DIAGNOSIS — Z85038 Personal history of other malignant neoplasm of large intestine: Secondary | ICD-10-CM | POA: Insufficient documentation

## 2019-06-04 DIAGNOSIS — Z87891 Personal history of nicotine dependence: Secondary | ICD-10-CM | POA: Diagnosis not present

## 2019-06-04 DIAGNOSIS — R600 Localized edema: Secondary | ICD-10-CM | POA: Diagnosis present

## 2019-06-04 LAB — CBC WITH DIFFERENTIAL/PLATELET
Abs Immature Granulocytes: 0.1 10*3/uL — ABNORMAL HIGH (ref 0.00–0.07)
Basophils Absolute: 0 10*3/uL (ref 0.0–0.1)
Basophils Relative: 1 %
Eosinophils Absolute: 0.2 10*3/uL (ref 0.0–0.5)
Eosinophils Relative: 3 %
HCT: 37.4 % (ref 36.0–46.0)
Hemoglobin: 11 g/dL — ABNORMAL LOW (ref 12.0–15.0)
Immature Granulocytes: 2 %
Lymphocytes Relative: 12 %
Lymphs Abs: 0.8 10*3/uL (ref 0.7–4.0)
MCH: 27.2 pg (ref 26.0–34.0)
MCHC: 29.4 g/dL — ABNORMAL LOW (ref 30.0–36.0)
MCV: 92.6 fL (ref 80.0–100.0)
Monocytes Absolute: 0.7 10*3/uL (ref 0.1–1.0)
Monocytes Relative: 10 %
Neutro Abs: 4.9 10*3/uL (ref 1.7–7.7)
Neutrophils Relative %: 72 %
Platelets: 245 10*3/uL (ref 150–400)
RBC: 4.04 MIL/uL (ref 3.87–5.11)
RDW: 18.9 % — ABNORMAL HIGH (ref 11.5–15.5)
WBC: 6.8 10*3/uL (ref 4.0–10.5)
nRBC: 0 % (ref 0.0–0.2)

## 2019-06-04 LAB — BASIC METABOLIC PANEL
Anion gap: 9 (ref 5–15)
BUN: 28 mg/dL — ABNORMAL HIGH (ref 6–20)
CO2: 21 mmol/L — ABNORMAL LOW (ref 22–32)
Calcium: 8.9 mg/dL (ref 8.9–10.3)
Chloride: 113 mmol/L — ABNORMAL HIGH (ref 98–111)
Creatinine, Ser: 0.87 mg/dL (ref 0.44–1.00)
GFR calc Af Amer: >60 mL/min (ref >60)
GFR calc non Af Amer: >60 mL/min (ref >60)
Glucose, Bld: 117 mg/dL — ABNORMAL HIGH (ref 70–99)
Potassium: 3.9 mmol/L (ref 3.5–5.1)
Sodium: 143 mmol/L (ref 135–145)

## 2019-06-04 LAB — LACTIC ACID, PLASMA: Lactic Acid, Venous: 1.8 mmol/L (ref 0.5–1.9)

## 2019-06-04 MED ORDER — DOXYCYCLINE HYCLATE 100 MG PO CAPS: 100.0000 mg | ORAL_CAPSULE | Freq: Two times a day (BID) | ORAL | 0 refills | Status: AC

## 2019-06-04 MED ORDER — DOXYCYCLINE HYCLATE 100 MG PO TABS
100.0000 mg | ORAL_TABLET | Freq: Once | ORAL | Status: AC
  Administered 2019-06-04: 100 mg via ORAL
  Filled 2019-06-04: qty 1

## 2019-06-04 NOTE — ED Triage Notes (Signed)
Pt BIBA from home. Pt was dx with cellulitis 1 month ago. Pt as noticed lesions on right shin, oozing pus. Pt reports redness, swelling,and warmth to the area. Has not been on abx. Pt reports swelling is more than normal.

## 2019-06-04 NOTE — ED Notes (Signed)
Per lab recollected needed on Lavender top, RN advised. [employer]

## 2019-06-04 NOTE — Discharge Instructions (Signed)
You have been diagnosed today with bilateral edema of the lower extremities, cellulitis of the right leg.  At this time there does not appear to be the presence of an emergent medical condition, however there is always the potential for conditions to change. Please read and follow the below instructions.  Please return to the Emergency Department immediately for any new or worsening symptoms or if your symptoms do not improve within 3 days. Please be sure to follow up with your Primary Care Provider within one week regarding your visit today; please call their office to schedule an appointment even if you are feeling better for a follow-up visit. Please take antibiotic doxycycline as prescribed for the possible infection of your right lower leg.  Please elevate your legs often to help with the swelling and drainage.  Please call your primary care doctor's office today to schedule a follow-up appointment for reevaluation in 2-3 days.  Get help right away if: You feel very sleepy. You see red streaks coming from the area. Your red area gets larger. Your red area turns dark in color. You have fever or chills You have chest pain or shortness of breath You have numbness/weakness, tingling You have confusion You have nausea or vomiting You have any new/concerning or worsening symptoms  Please read the additional information packets attached to your discharge summary.  Do not take your medicine if  develop an itchy rash, swelling in your mouth or lips, or difficulty breathing; call 911 and seek immediate emergency medical attention if this occurs.

## 2019-06-04 NOTE — ED Provider Notes (Addendum)
[facility] DEPT Provider Note   CSN: [id number] Arrival date & time: 06/04/19  7654     History   Chief Complaint Chief Complaint  Patient presents with  . Cellulitis    HPI Martha Wood is a 48 y.o. female with history of colon cancer currently in remission, diabetes, obesity, lymphedema presents today for concern of cellulitis of the right lower extremity.  Patient was admitted for cellulitis of the bilateral lower extremities on 05/03/2019, she was treated with IV vancomycin and Rocephin, transitioned to doxycycline and discharged.  Patient reports that she finished her outpatient doxycycline however since that time she has redeveloped erythema on her bilateral anterior lower legs.  She reports right is worse than left and the area has started weeping over the past several days.  She is concerned for recurrence of cellulitis.  She reports feeling warm at home however has not measured a fever, she endorses intermittent chills, denies headache, chest pain, shortness of breath, abdominal pain, nausea/vomiting, diarrhea, fall/injury, numbness/tingling, weakness or any additional concerns.    HPI  Past Medical History:  Diagnosis Date  . Cancer ([place])   . Colon cancer ([place])   . Diabetes mellitus without complication ([place])   . Lymphedema   . Obesity   . Pneumonia     Patient Active Problem List   Diagnosis Date Noted  . Cellulitis of leg 05/03/2019  . Lactic acidosis 05/03/2019  . Hyperglycemia 05/03/2019  . Depression 05/03/2019  . History of colon cancer 05/03/2019  . Lymphedema 05/03/2019    Past Surgical History:  Procedure Laterality Date  . ABDOMINAL HYSTERECTOMY    . CESAREAN SECTION    . COLON SURGERY    . HERNIA REPAIR       OB History   No obstetric history on file.      Home Medications    Prior to Admission medications   Medication Sig Start Date End Date Taking? Authorizing Provider  ARIPiprazole (ABILIFY) 5 MG  tablet Take 5 mg by mouth daily. 04/22/19   [provider]  blood glucose meter kit and supplies KIT Dispense based on patient and insurance preference. Use up to four times daily as directed. (FOR ICD-9 250.00, 250.01). 05/06/19   Bonnielee Haff, MD  chlorthalidone (HYGROTON) 25 MG tablet Take 25 mg by mouth daily. 04/22/19   [provider]  clonazePAM (KLONOPIN) 0.5 MG tablet Take 0.5 mg by mouth daily as needed for anxiety.  04/22/19   [provider]  doxycycline (VIBRAMYCIN) 100 MG capsule Take 1 capsule (100 mg total) by mouth 2 (two) times daily for 7 days. 06/04/19 06/11/19  Nuala Alpha A, PA-C  DULoxetine (CYMBALTA) 30 MG capsule Take 30 mg by mouth daily. 04/22/19   [provider]  glipiZIDE (GLUCOTROL XL) 10 MG 24 hr tablet Take 10 mg by mouth daily. 05/13/19   [provider]  ibuprofen (ADVIL) 800 MG tablet Take 800 mg by mouth every 8 (eight) hours as needed for mild pain.    [provider]  meloxicam (MOBIC) 15 MG tablet Take 15 mg by mouth daily. 04/22/19   [provider]  metoprolol succinate (TOPROL-XL) 25 MG 24 hr tablet Take 25 mg by mouth daily. 04/22/19   [provider]  potassium chloride (K-DUR) 10 MEQ tablet Take 10 mEq by mouth daily. 04/22/19   [provider]  telmisartan (MICARDIS) 80 MG tablet Take 80 mg by mouth daily. 04/22/19   [provider]  topiramate (  TOPAMAX) 25 MG tablet Take 25 mg by mouth 2 (two) times daily. 02/18/19   [provider]  VESICARE 10 MG tablet Take 10 mg by mouth daily. 04/22/19   [provider]  VICTOZA 18 MG/3ML SOPN Inject 1.8 mg into the skin daily. 03/22/19   [provider]    Family History Family History  Problem Relation Age of Onset  . Hypertension Mother   . High Cholesterol Mother   . Healthy Father   . High Cholesterol Father     Social History Social History   Tobacco Use  . Smoking status: Former Smoker     Packs/day: 0.50    Types: Cigarettes    Quit date: 2017    Years since quitting: 3.7  . Smokeless tobacco: Never Used  Substance Use Topics  . Alcohol use: No  . Drug use: Yes    Types: Marijuana     Allergies   Diphenhydramine, Codeine, Morphine and related, and Latex   Review of Systems Review of Systems Ten systems are reviewed and are negative for acute change except as noted in the HPI   Physical Exam Updated Vital Signs BP 116/61   Pulse 72   Temp 98.6 F (37 C) (Oral)   Resp (!) 25   Wt (!) 169 kg   LMP 05/30/2014   SpO2 100%   BMI 60.14 kg/m   Physical Exam Constitutional:      General: She is not in acute distress.    Appearance: Normal appearance. She is well-developed. She is obese. She is not ill-appearing or diaphoretic.  HENT:     Head: Normocephalic and atraumatic.     Right Ear: External ear normal.     Left Ear: External ear normal.     Nose: Nose normal.  Eyes:     General: Vision grossly intact. Gaze aligned appropriately.     Pupils: Pupils are equal, round, and reactive to light.  Neck:     Musculoskeletal: Normal range of motion.     Trachea: Trachea and phonation normal. No tracheal deviation.  Cardiovascular:     Rate and Rhythm: Normal rate and regular rhythm.     Pulses:          Dorsalis pedis pulses are 1+ on the right side and 1+ on the left side.  Pulmonary:     Effort: Pulmonary effort is normal. No respiratory distress.  Abdominal:     General: There is no distension.     Palpations: Abdomen is soft.     Tenderness: There is no abdominal tenderness. There is no guarding or rebound.  Musculoskeletal: Normal range of motion.     Right lower leg: 2+ Edema present.     Left lower leg: 2+ Edema present.  Feet:     Right foot:     Protective Sensation: 3 sites tested. 3 sites sensed.     Left foot:     Protective Sensation: 3 sites tested. 3 sites sensed.  Skin:    General: Skin is warm and dry.     Comments: Bilateral  anterior tibial area erythema.  Right lower leg with serous weeping.  Neurological:     Mental Status: She is alert.     GCS: GCS eye subscore is 4. GCS verbal subscore is 5. GCS motor subscore is 6.     Comments: Speech is clear and goal oriented, follows commands Major Cranial nerves without deficit, no facial droop Moves extremities without ataxia, coordination intact  Psychiatric:        Behavior: Behavior normal.          ED Treatments / Results  Labs (all labs ordered are listed, but only abnormal results are displayed) Labs Reviewed  BASIC METABOLIC PANEL - Abnormal; Notable for the following components:      Result Value   Chloride 113 (*)    CO2 21 (*)    Glucose, Bld 117 (*)    BUN 28 (*)    All other components within normal limits  CBC WITH DIFFERENTIAL/PLATELET - Abnormal; Notable for the following components:   Hemoglobin 11.0 (*)    MCHC 29.4 (*)    RDW 18.9 (*)    Abs Immature Granulocytes 0.10 (*)    All other components within normal limits  CULTURE, BLOOD (ROUTINE X 2)  CULTURE, BLOOD (ROUTINE X 2)  LACTIC ACID, PLASMA  CBC WITH DIFFERENTIAL/PLATELET  LACTIC ACID, PLASMA    EKG None  Radiology No results found.  Procedures Procedures (including critical care time)  Medications Ordered in ED Medications  doxycycline (VIBRA-TABS) tablet 100 mg (has no administration in time range)     Initial Impression / Assessment and Plan / ED Course  I have reviewed the triage vital signs and the nursing notes.  Pertinent labs & imaging results that were available during my care of the patient were reviewed by me and considered in my medical decision making (see chart for details).    CBC hemoglobin 11 appears similar to prior otherwise no acute findings BMP with normal creatinine, BUN elevated at 28, improved from prior, no bleeding symptoms, nonacute Lactic 1.8 Blood cultures pending - Patient overall well-appearing in no acute distress, vital  signs stable.  Neurovascular intact to bilateral lower extremities.  She has 2+ bilateral pitting edema that appears similar to previous evaluations.  Lower extremities appear symmetric, doubt DVT, septic arthritis, compartment syndrome or other acute pathologies at this time.  She has some pretibial erythema and serous drainage from the right leg.  Based on history, physical and laboratory work-up today this may be secondary to her dependency, stasis and edema rather than infection.  Review of previous wound culture shows susceptibility to tetracyclines.  Discussed case with Dr. Melina Copa, plan of care at this time is to start patient back on doxycycline for possibility of infection, encourage elevation of the extremities and to follow-up with primary care provider for reevaluation.  No indication for admission or further work-up at this time.  Patient reevaluated resting comfortably and in no acute distress.  She states understanding care plan and is agreeable.  At this time there does not appear to be any evidence of an acute emergency medical condition and the patient appears stable for discharge with appropriate outpatient follow up. Diagnosis was discussed with patient who verbalizes understanding of care plan and is agreeable to discharge. I have discussed return precautions with patient who verbalizes understanding of return precautions. Patient encouraged to follow-up with their PCP. All questions answered.  Patient has been discharged in good condition.   Note: Portions of this report may have been transcribed using voice recognition software. Every effort was made to ensure accuracy; however, inadvertent computerized transcription errors may still be present. Final Clinical Impressions(s) / ED Diagnoses   Final diagnoses:  Bilateral edema of lower extremity  Cellulitis of right leg    ED Discharge Orders         Ordered    doxycycline (VIBRAMYCIN) 100 MG capsule  2 times  daily      06/04/19 1337            Gari Crown 06/04/19 1347    Hayden Rasmussen, MD 06/04/19 Dorthula Perfect

## 2019-06-04 NOTE — ED Notes (Signed)
Patient denies pain and is resting comfortably.  

## 2019-06-09 LAB — CULTURE, BLOOD (ROUTINE X 2)
Culture: NO GROWTH
Culture: NO GROWTH
Special Requests: ADEQUATE

## 2019-06-23 ENCOUNTER — Other Ambulatory Visit: Payer: Self-pay

## 2019-06-23 ENCOUNTER — Emergency Department (HOSPITAL_COMMUNITY)
Admission: EM | Admit: 2019-06-23 | Discharge: 2019-06-23 | Discharge status: Against Medical Advice | Payer: Medicaid Other | Attending: Emergency Medicine | Admitting: Emergency Medicine

## 2019-06-23 ENCOUNTER — Encounter (HOSPITAL_COMMUNITY): Payer: Self-pay

## 2019-06-23 DIAGNOSIS — Z5321 Procedure and treatment not carried out due to patient leaving prior to being seen by health care provider: Secondary | ICD-10-CM | POA: Diagnosis not present

## 2019-06-23 DIAGNOSIS — M79676 Pain in unspecified toe(s): Secondary | ICD-10-CM | POA: Diagnosis present

## 2019-06-23 LAB — BASIC METABOLIC PANEL
Anion gap: 11 (ref 5–15)
BUN: 27 mg/dL — ABNORMAL HIGH (ref 6–20)
CO2: 22 mmol/L (ref 22–32)
Calcium: 9 mg/dL (ref 8.9–10.3)
Chloride: 107 mmol/L (ref 98–111)
Creatinine, Ser: 0.99 mg/dL (ref 0.44–1.00)
GFR calc Af Amer: >60 mL/min (ref >60)
GFR calc non Af Amer: >60 mL/min (ref >60)
Glucose, Bld: 175 mg/dL — ABNORMAL HIGH (ref 70–99)
Potassium: 4.3 mmol/L (ref 3.5–5.1)
Sodium: 140 mmol/L (ref 135–145)

## 2019-06-23 LAB — CBC
HCT: 42.8 % (ref 36.0–46.0)
Hemoglobin: 13.1 g/dL (ref 12.0–15.0)
MCH: 28 pg (ref 26.0–34.0)
MCHC: 30.6 g/dL (ref 30.0–36.0)
MCV: 91.5 fL (ref 80.0–100.0)
Platelets: 286 10*3/uL (ref 150–400)
RBC: 4.68 MIL/uL (ref 3.87–5.11)
RDW: 17.9 % — ABNORMAL HIGH (ref 11.5–15.5)
WBC: 9.7 10*3/uL (ref 4.0–10.5)
nRBC: 0 % (ref 0.0–0.2)

## 2019-06-23 NOTE — ED Notes (Signed)
Per registration, pt stated she was leaving to go to a hospital closer to her house.

## 2019-06-23 NOTE — ED Notes (Signed)
PT reports R leg is going numb sitting in wheelchair.  States ongoing problem for the last 3 months.  Offered to assist pt to reposition an elevate leg and she declined.  Assisted her to place foot on foot rest on wheelchair.  Explained triage process and wait for treatment room.

## 2019-06-23 NOTE — ED Triage Notes (Signed)
Patient complains of bilateral foot pain and numbness that has worsened over the past 3 months. States that she has recently been diagnosed with diabetes and unsure if related. Patient denies trauma. Feet swollen with swelling

## 2019-08-10 ENCOUNTER — Ambulatory Visit: Payer: Medicaid Other

## 2019-08-16 ENCOUNTER — Other Ambulatory Visit: Payer: Self-pay

## 2019-08-16 ENCOUNTER — Ambulatory Visit: Payer: Medicaid Other | Attending: Family Medicine

## 2019-08-16 DIAGNOSIS — M6281 Muscle weakness (generalized): Secondary | ICD-10-CM | POA: Insufficient documentation

## 2019-08-16 DIAGNOSIS — I890 Lymphedema, not elsewhere classified: Secondary | ICD-10-CM | POA: Diagnosis present

## 2019-08-16 NOTE — Therapy (Signed)
[facility] [place], [place], [address] Phone: [phone]   Fax:  [phone]  Physical Therapy Evaluation  Patient Details  Name: Martha Wood MRN: [id number] Date of Birth: Mar 11, 1971 Referring Provider (PT): Karma Lew PA   Encounter Date: 08/16/2019  PT End of Session - 08/16/19 1638    Visit Number  1    Number of Visits  4    Date for PT Re-Evaluation  09/16/19    Authorization Type  Medicaid    PT Start Time  1300    PT Stop Time  1400    PT Time Calculation (min)  60 min    Activity Tolerance  Patient tolerated treatment well    Behavior During Therapy  [facility] for tasks assessed/performed       Past Medical History:  Diagnosis Date  . Cancer ([place])   . Colon cancer ([place])   . Diabetes mellitus without complication ([place])   . Lymphedema   . Obesity   . Pneumonia     Past Surgical History:  Procedure Laterality Date  . ABDOMINAL HYSTERECTOMY    . CESAREAN SECTION    . COLON SURGERY    . HERNIA REPAIR      There were no vitals filed for this visit.   Subjective Assessment - 08/16/19 1300    Subjective  Pt reports that she has been struggling with her lymphedema for about 2-2.5 years following her diagnosis of colon cancer in 2017. She has previously had 2 c-sections for both of her children and for an ostomy placement. Pt states that she has had some bouts of cellulitis and wounds on her LE with her RLE being worst than her LLE. She states that over the past 3 months have been the worst becuase her swelling has gotten really bad due to cellulitis in her legs. She has been utilizing unna boots for the past few weeks that have really helped her swelling and pain/wounds on her lower extremities. She states that she has noticed some more fluid build up in her thighs since she has been wearing the unna boots. She does have a vasopneumatic pump but has not been using it becuase she states that it does  not work. Her pump only goes just above the knee and does not cover the trunk.    Pertinent History  History of colon cancer, hx cellulitis, DM II    Limitations  Standing;Walking    Diagnostic tests  [place] with no signs of DVT    Patient Stated Goals  I want to be able to still be able to use the techniques that I have learned in my daily routine to be able to perform every day tasks.    Currently in Pain?  Yes    Pain Score  3     Pain Location  Leg    Pain Orientation  Right;Left    Pain Descriptors / Indicators  Aching    Pain Type  Chronic pain    Pain Onset  More than a month ago    Pain Frequency  Constant    Aggravating Factors   increased swelling    Pain Relieving Factors  decreased fluid and rest    Effect of Pain on Daily Activities  Pt has difficulty walking due to heaviness and pain in her legs.         [facility] PT Assessment - 08/16/19 0001      Assessment   Medical Diagnosis  Bil LE lymphedema, Colon cancer    Referring Provider (PT)  Karma Lew PA    Onset Date/Surgical Date  05/28/16    Next MD Visit  09/02/2019    Prior Therapy  None      Balance Screen   Has the patient fallen in the past 6 months  Yes    How many times?  2    Has the patient had a decrease in activity level because of a fear of falling?   Yes    Is the patient reluctant to leave their home because of a fear of falling?   No      Home Environment   Living Environment  Private residence    Living Arrangements  Children;Parent    Available Help at Discharge  Family    Type of [place] to enter    Entrance Stairs-Number of Steps  3    Entrance Stairs-Rails  Left    [place]  Two level;Able to live on main level with bedroom/bathroom    [place] - single point;[place] - 4 wheels      Prior Function   Level of Independence  Independent with household mobility with device    Vocation  On disability      Cognition   Overall Cognitive Status  Within  Functional Limits for tasks assessed      ROM / Strength   AROM / PROM / Strength  Strength      Strength   Strength Assessment Site  Hip;Knee;Ankle    Right/Left Hip  Right;Left    Right Hip Flexion  3/5    Right Hip ABduction  3+/5    Right Hip ADduction  3+/5    Left Hip Flexion  3/5    Left Hip ABduction  3+/5    Left Hip ADduction  3+/5    Right/Left Knee  Right;Left    Right Knee Flexion  4-/5    Right Knee Extension  4-/5    Left Knee Flexion  4+/5    Left Knee Extension  4+/5    Right/Left Ankle  Right;Left    Right Ankle Dorsiflexion  4/5    Left Ankle Dorsiflexion  4/5        LYMPHEDEMA/ONCOLOGY QUESTIONNAIRE - 08/16/19 1344      Type   Cancer Type  Colon cancer      Surgeries   Other Surgery Date  05/28/16      Treatment   Active Chemotherapy Treatment  No    Past Chemotherapy Treatment  Yes    Date  05/17/16   approximate   Active Radiation Treatment  No    Past Radiation Treatment  Yes    Date  05/17/16   approximate date   Body Site  pelvic area      What other symptoms do you have   Are you Having Heaviness or Tightness  Yes    Are you having Pain  Yes    Are you having pitting edema  Yes    Body Site  B lower legs and medial thigh     Is it Hard or Difficult finding clothes that fit  Yes    Do you have infections  Yes    Comments  Pt has had recent cellulitis infections in BLE    Stemmer Sign  Yes      Lymphedema Stage   Stage  STAGE 3 ELEPHANTIASIS  Lymphedema Assessments   Lymphedema Assessments  Lower extremities      Right Lower Extremity Lymphedema   10 cm Proximal to Suprapatella  73 cm    At Midpatella/Popliteal Crease  46.5 cm    30 cm Proximal to Floor at Lateral Plantar Foot  53.5 cm    20 cm Proximal to Floor at Lateral Plantar Foot  43.'5 1    10 '$ cm Proximal to Floor at Lateral Malleoli  29 cm    5 cm Proximal to 1st MTP Joint  24.5 cm    Around Proximal Great Toe  8.5 cm      Left Lower Extremity Lymphedema   10 cm  Proximal to Suprapatella  72.4 cm    At Midpatella/Popliteal Crease  49 cm    30 cm Proximal to Floor at Lateral Plantar Foot  55 cm    20 cm Proximal to Floor at Lateral Plantar Foot  43.2 cm    10 cm Proximal to Floor at Lateral Malleoli  31 cm    5 cm Proximal to 1st MTP Joint  26 cm    Around Proximal Great Toe  8.8 cm            VOR testing was negative for slow VOR, convergence and smooth pursuits     [facility]patient [facility]  Lymphedema Life Impact Scale Total Score  72.06 %      Objective measurements completed on examination: See above findings.              PT Education - 08/16/19 1637    Education Details  Access Code: [email or web address], pt was provided with lymphatic facilitation exercises for the Bil LE. She was also provided with information on the anatomy/physiology of the lymphatic system, stages and risk reduction practices.    Person(s) Educated  Patient    Methods  Explanation    Comprehension  Verbalized understanding       PT Short Term Goals - 08/16/19 1644      PT SHORT TERM GOAL #1   Title  Pt will be independent with initial HEP within 3 weeks.    Baseline  Pt does not have an HEP    Time  3    Period  Weeks    Status  New    Target Date  09/13/19        PT Long Term Goals - 08/16/19 1645      PT LONG TERM GOAL #1   Title  Pt will report 15% improvement in pain with ambulation within 3 weeks in order to decrease risk for immobility    Baseline  3/10 pain in BLE    Time  3    Period  Weeks    Status  New    Target Date  09/13/19      PT LONG TERM GOAL #2   Title  Pt will demonstrate self MLD of the trunk with good form, skin stretch and direction within 3 weeks in order to promote autonomy of care.    Baseline  pt is unaware of how to perform self MLD.    Time  3    Period  Weeks    Status  New    Target Date  09/13/19      PT LONG TERM GOAL #3   Title  Pt will sore 65% on  the lymphedema life impace scale within 3 weeks .    Baseline  73%    Time  3    Period  Weeks    Status  New    Target Date  09/13/19              Plan - 08/16/19 1639    Clinical Impression Statement  Pt presents to physical therapy with stage III lymphedema noted in BLE with scaling of the skin and obvious scarring from old wounds. She has been wearing unna boots which has decreased the edema in her Bil LE but has increased edema in Bil medial thighs. She currently has a vasopneumatic pump that pt states only goes up to the mid thigh Bil; pt needs compression into the abdomen due to hardening of the skin in this area. Pt measurements indicated Bil LE with positive stemmers sign Bil and with pitting edema in dorsum of the foot for +1 and medial distal thigh +1. Pt demonstrates significant weakness in Bil LE with R>L and has had 2 falls within the past 6 months. She was negative for any vestibular deficits. Due to insurance limitations and nature of lymphdema pt will benefit from reduction kit and skilled physical therapy services 1x/week for 3 weeks in order to decrease risk for infection and immobility.    Personal Factors and Comorbidities  Age;Comorbidity 2    Comorbidities  obesity, DM II    Stability/Clinical Decision Making  Stable/Uncomplicated    Clinical Decision Making  Low    Rehab Potential  Good    PT Frequency  1x / week   insurance limitations   PT Duration  3 weeks    PT Treatment/Interventions  ADLs/Self Care Home Management;Electrical Stimulation;Iontophoresis '4mg'$ /ml Dexamethasone;Moist Heat;Traction;Gait training;Stair training;Functional mobility training;Therapeutic activities;Therapeutic exercise;Balance training;Neuromuscular re-education;Patient/family education;Manual techniques;Manual lymph drainage;Compression bandaging;Vasopneumatic Device    PT Next Visit Plan  Assess HEP begin to teach self MLD of the trunk.    PT Home Exercise Plan  Access Code: [email or web address]     Consulted and Agree with Plan of Care  Patient       Patient will benefit from skilled therapeutic intervention in order to improve the following deficits and impairments:  Decreased balance, Pain, Increased edema  Visit Diagnosis: Lymphedema  Muscle weakness (generalized)     Problem List Patient Active Problem List   Diagnosis Date Noted  . Cellulitis of leg 05/03/2019  . Lactic acidosis 05/03/2019  . Hyperglycemia 05/03/2019  . Depression 05/03/2019  . History of colon cancer 05/03/2019  . Lymphedema 05/03/2019    Ander Purpura, PT 08/16/2019, 4:49 PM  [facility] [place], [place], [address] Phone: [phone]   Fax:  [phone]  Name: Martha Wood MRN: [id number] Date of Birth: [birth year]

## 2019-08-16 NOTE — Patient Instructions (Addendum)
Access Code: [email or web address]  URL: [email or web address]  Date: 08/16/2019  Prepared by: Tomma Rakers   Exercises Standing Upper Cervical Flexion and Extension - 20 reps - 1 sets - 1x daily - 7x weekly Standing Cervical Sidebending AROM - 20 reps - 1 sets - 1x daily - 7x weekly Standing Cervical Rotation AROM - 20 reps - 1 sets - 1x daily - 7x weekly Standing Shoulder Flexion Full Range - 20 reps - 1 sets - 1x daily - 7x weekly Standing Shoulder Shrugs - 20 reps - 1 sets - 1x daily - 7x weekly Standing Scapular Retraction - 20 reps - 1 sets - 1x daily - 7x weekly Trunk Sidebending with Compression Garment - 20 reps - 1 sets - 1x daily - 7x weekly Standing Hip Extension with Counter Support - 20 reps - 1 sets - 1x daily - 7x weekly Seated Knee Flexion Extension AROM - 20 reps - 1 sets - 1x daily - 7x weekly Heel Toe Raises with Counter Support - 20 reps - 1 sets - 1x daily - 7x weekly                      [facility]   [address] [place] [address]   [phone]   What is Lymphedema Lymphedema is when fluid from the body's tissues that usually drains into lymphatic vessels is unable to drain. This fluid is called lymph. Lymphatic vessels carry the lymph fluid to lymph nodes where substances that could be harmful, such as bacteria, are filtered out and destroyed. This helps to protect the body from infection. The lymph then passes back into the main blood vessels. There are lymph nodes all around the body, including the armpit, groin, abdomen, chest, elbows, knees and neck. Primary Lymphedema Primary lymphedema is when the lymph nodes are not working properly, there are not enough lymph nodes or lymph nodes are not present at all since birth. This will cause swelling over time usually presenting itself in the teenage years to early 71's.  Secondary Lymphedema Secondary lymphedema occurs due to the damage, blockage or absence of lymph vessels due to  injury, surgery or with certain conditions such as veins not functioning properly, and abnormal accumulation of fat tissue called lipedema.   Stages of Lymphedema Stage 0 - No swelling is present but there is an increased risk due to vascular disease, surgery or injury. Stage 1 - Visible swelling is present that decreases temporarily with elevation but will return soon after arm or leg is placed back in normal position. ie: swelling goes down at night and increases throughout the day.  Stage 2 - Swelling increases and skin changes begin including firming of the skin due to fibrosis which is hardening related to normal tissue getting replaced by scar tissue due to damage from prolonged swelling.  Stage 3 - Tissue becomes even more swollen and thickened causing the arm or leg to change shape substantially increasing risk for infection, skin break down and mobility issues. This stage is still treatable and able to improve but cannot be reversed to an earlier stage.  Lymphedema Treatment Lymphedema is treated best through Combined Decongestive Therapy which is a combination of light massage performed by yourself and a trained professional called manual lymph drainage (MLD), compression with wraps or compression garments, light exercise and skin care.  [facility]   [address] [place] [address]   [phone]  Care of Lymphedema Bandages Bandages used in multi-layered compression wrapping in the management of lymphedema require special care to keep them in good condition.  Wendee Copp your bandages at least once a week or more depending on if they get wet or soiled using mild detergent and hot water on the gentle cycle in the washing machine. Use a lingerie bag or loop the bandages in large loops and rubber band in the middle to keep bandages from getting tangled in the wash. (Do not use woolite, bleach or fabric softener) . Dry the bandages on a towel on a flat  surface to avoid putting tension on the bandages and risk losing the elasticity. If you must hang the bandages fold them in half before putting on a rack to avoid as much tension as possible on the bandages. Bandages usually dry over night but if they have not dried and you are going to treatment you may put them in the dryer on the no-heat option. Do not hang bandages in the direct sunlight because this will damage the elastic fibers.  . Do not wring the bandages or twist while wet. You may roll them gently in a towel and press to get excess water out to speed up the drying process.  . Only wash the brown bandages. Foam pieces and cotton are not washable and must be replaced once soiling or break down occurs.  It is very important to maintain clean bandages in order to decrease risk of infection from contamination through dirty bandages.  Signs of Infection Redness, warmth, pain and increased swelling are all signs of infection and you must contact your medical doctor as soon as you notice any of these symptoms in order to decrease your risk of serious infection.  Reasons to Remove Compression Wrap  Remove your compression wrap if any of the following occur: Throbbing, numbness, tingling and pain. Before removing wraps see if you can remove the last bandage placed or do your exercises. If symptoms are not relieved remove the entire wrap and let your therapist know on your next visit.  Risk Reduction Practices Wear compression and perform exercises as recommended by your healthcare provider.  Avoid tight clothing that restricts your swollen limb.  Avoid soaking in hot tubs. Walk 30 minutes 6 times a week or appropriate cardio activity recommended.

## 2019-08-23 ENCOUNTER — Ambulatory Visit: Payer: Medicaid Other | Attending: Family Medicine

## 2019-08-23 ENCOUNTER — Other Ambulatory Visit: Payer: Self-pay

## 2019-08-23 DIAGNOSIS — M6281 Muscle weakness (generalized): Secondary | ICD-10-CM | POA: Diagnosis present

## 2019-08-23 DIAGNOSIS — I890 Lymphedema, not elsewhere classified: Secondary | ICD-10-CM | POA: Insufficient documentation

## 2019-08-23 NOTE — Patient Instructions (Signed)
Deep Effective Breath   Standing, sitting, or laying down place both hands on the belly. Take a deep breath IN, expanding the belly; then breath OUT, contracting the belly. Repeat __5__ times. Do __2-3__ sessions per day and before each self massage.  http://gt2.exer.us/866   Copyright  VHI. All rights reserved.  Inguinal Nodes to Axilla - Clear   On both sides, at armpit and at the groin (below the underwear line), make _5__ in-place circles. Then from hip proceed in sections to armpit with sweeping 10 times then skin stretch from groin to arm pit on both sides for 10 repetitions Do _1__ time per day.  Copyright  VHI. All rights reserved.  LEG: Knee to Hip - Clear   Pump up outer thigh of involved leg from knee to outer hip. Then do stationary circles from inner to outer thigh, then do outer thigh again. Next, interlace fingers behind knee IF ABLE and make in-place circles. Do _10_ times of each sequence.  Do _1__ time per day.

## 2019-08-23 NOTE — Therapy (Signed)
[place], [place], [address] Phone: [phone]   Fax:  [phone]  Physical Therapy Treatment  Patient Details  Name: Martha Wood MRN: [id number] Date of Birth: [birth year] Referring Provider (PT): Karma Lew PA   Encounter Date: 08/23/2019  PT End of Session - 08/23/19 1108    Visit Number  2    Number of Visits  4    Date for PT Re-Evaluation  09/16/19    Authorization Type  Medicaid    PT Start Time  1105    PT Stop Time  1200    PT Time Calculation (min)  55 min    Activity Tolerance  Patient tolerated treatment well    Behavior During Therapy  [facility] for tasks assessed/performed       Past Medical History:  Diagnosis Date  . Cancer ([place])   . Colon cancer ([place])   . Diabetes mellitus without complication ([place])   . Lymphedema   . Obesity   . Pneumonia     Past Surgical History:  Procedure Laterality Date  . ABDOMINAL HYSTERECTOMY    . CESAREAN SECTION    . COLON SURGERY    . HERNIA REPAIR      There were no vitals filed for this visit.  Subjective Assessment - 08/23/19 1108    Subjective  Pt reports that her neuropathy has been bothering her lately but she continues to work on her exercises and is working on increasing her ambulation. She states that she is walking for about 10 minutes she is limited by fatigue and pain.    Pertinent History  History of colon cancer, hx cellulitis, DM II    Limitations  Standing;Walking    Diagnostic tests  [place] with no signs of DVT    Patient Stated Goals  I want to be able to still be able to use the techniques that I have learned in my daily routine to be able to perform every day tasks.    Currently in Pain?  Yes    Pain Score  3     Pain Location  Leg    Pain Orientation  Right;Left    Pain Descriptors / Indicators  Aching    Pain Type  Chronic pain    Pain Onset  More than a month ago    Pain Frequency  Constant    Aggravating Factors    increased swelling    Pain Relieving Factors  decreased fluid and rest    Effect of Pain on Daily Activities  pt has difficulty walking due to heaviness and pain in her legs.            LYMPHEDEMA/ONCOLOGY QUESTIONNAIRE - 08/23/19 1353      Right Lower Extremity Lymphedema   20 cm Proximal to Suprapatella  77 cm   proximal thigh    10 cm Proximal to Suprapatella  68.5 cm   distal thigh    At Midpatella/Popliteal Crease  48 cm    30 cm Proximal to Floor at Lateral Plantar Foot  55 cm   calf   10 cm Proximal to Floor at Lateral Malleoli  28.5 cm   distal lower leg   5 cm Proximal to 1st MTP Joint  24 cm   mid foot     Left Lower Extremity Lymphedema   20 cm Proximal to Suprapatella  75.5 cm   proximal thigh    10 cm Proximal to Suprapatella  64 cm  distal thigh    At Midpatella/Popliteal Crease  47 cm    30 cm Proximal to Floor at Lateral Plantar Foot  55.5 cm   calf   10 cm Proximal to Floor at Lateral Malleoli  30 cm   distal lower leg   5 cm Proximal to 1st MTP Joint  24.8 cm   mid foot          Outpatient Rehab from 08/16/2019 in [place]  Lymphedema Life Impact Scale Total Score  72.06 %           [facility] Adult PT Treatment/Exercise - 08/23/19 0001      Manual Therapy   Manual Therapy  Edema management;Manual Lymphatic Drainage (MLD)    Edema Management  Pt was measured for reduction kit, Regluar/Short Bil thighs and Regular/regluar for Bil lower legs.     Manual Lymphatic Drainage (MLD)  Pt was taught self MLD of the anterior trunk in supine with short neck, 5 diaphragmatic breathes, Bil axillary nodes, BIl inguinal nodes, Bil inguino-axillary anastomosis with sweeping 10x then scoop 10x up Bil inguino-axillary anastomosis to facilitate lymphatic flow out of the lower trunk and BLE. In supine physical therapist performed MLD for the anterior trunk 5 diaphragmatic breathes (no deep abdominalsdue to g-tube), swimming in  the terminus, Bil shoulders, Bil axillary nodes, BIl inguinal nodes, Bil inguino-axillary anastomosis, Bil inguino-axillary anastomosis figure 7, re-worked all surfaces then 5 diaphragmatic breathes to finish.              PT Education - 08/23/19 1320    Education Details  Pt was educated on self MLD for the trunk this session with extra emphasis on the the inguino-axillary anastomosis on order to establish fluid flow out of the trunk to decrease decongestion in the BLE with emphasis on direction, skin stretch and pressure using hand over hand tactile cueing, VC and demosntration.    Person(s) Educated  Patient    Methods  Explanation;Demonstration;Tactile cues;Verbal cues;Handout    Comprehension  Verbalized understanding;Returned demonstration       PT Short Term Goals - 08/16/19 1644      PT SHORT TERM GOAL #1   Title  Pt will be independent with initial HEP within 3 weeks.    Baseline  Pt does not have an HEP    Time  3    Period  Weeks    Status  New    Target Date  09/13/19        PT Long Term Goals - 08/16/19 1645      PT LONG TERM GOAL #1   Title  Pt will report 15% improvement in pain with ambulation within 3 weeks in order to decrease risk for immobility    Baseline  3/10 pain in BLE    Time  3    Period  Weeks    Status  New    Target Date  09/13/19      PT LONG TERM GOAL #2   Title  Pt will demonstrate self MLD of the trunk with good form, skin stretch and direction within 3 weeks in order to promote autonomy of care.    Baseline  pt is unaware of how to perform self MLD.    Time  3    Period  Weeks    Status  New    Target Date  09/13/19      PT LONG TERM GOAL #3   Title  Pt will sore 65% on  the lymphedema life impace scale within 3 weeks .    Baseline  73%    Time  3    Period  Weeks    Status  New    Target Date  09/13/19            Plan - 08/23/19 1107    Clinical Impression Statement  Pt presents with improved measurements from her  initial evaluation just utilizing exercise and increased ambulation. Pt presents today with continued edema in BIL and trunk. Pt was taught self MLD of the anterior trunk with an emphasis on direction, pressure and skin stretch. Pt was measured for a reduction kit this session for size regular/short for the thigh and regular/regular for the lower leg. Pt responded well to MLD with significant decrease in fibrotic tissue at the abdomen and Bil medial thighs following MLD to the anterior trunk. Pt will benefit from continued POC at this time.    Personal Factors and Comorbidities  Age;Comorbidity 2    Comorbidities  obesity, DM II    Rehab Potential  Good    PT Frequency  1x / week    PT Treatment/Interventions  ADLs/Self Care Home Management;Electrical Stimulation;Iontophoresis 49m/ml Dexamethasone;Moist Heat;Traction;Gait training;Stair training;Functional mobility training;Therapeutic activities;Therapeutic exercise;Balance training;Neuromuscular re-education;Patient/family education;Manual techniques;Manual lymph drainage;Compression bandaging;Vasopneumatic Device    PT Next Visit Plan  Assess self MLD of the trunk and thighs and exercises, continue with MLD of the trunk and possible LE    PT Home Exercise Plan  Access Code: [email or web address]   Consulted and Agree with Plan of Care  Patient       Patient will benefit from skilled therapeutic intervention in order to improve the following deficits and impairments:  Decreased balance, Pain, Increased edema  Visit Diagnosis: Lymphedema  Muscle weakness (generalized)     Problem List Patient Active Problem List   Diagnosis Date Noted  . Cellulitis of leg 05/03/2019  . Lactic acidosis 05/03/2019  . Hyperglycemia 05/03/2019  . Depression 05/03/2019  . History of colon cancer 05/03/2019  . Lymphedema 05/03/2019    CAnder Purpura PT 08/23/2019, 2:04 PM  [place] [address]Phone: [phone]  Fax:  [phone] Name: Martha PevehouseMRN: [id number] of Birth: [birth year]

## 2019-09-01 ENCOUNTER — Ambulatory Visit: Payer: Medicaid Other

## 2019-09-08 ENCOUNTER — Other Ambulatory Visit: Payer: Self-pay

## 2019-09-08 ENCOUNTER — Ambulatory Visit: Payer: Medicaid Other

## 2019-09-08 DIAGNOSIS — I890 Lymphedema, not elsewhere classified: Secondary | ICD-10-CM | POA: Diagnosis not present

## 2019-09-08 DIAGNOSIS — M6281 Muscle weakness (generalized): Secondary | ICD-10-CM

## 2019-09-08 NOTE — Therapy (Signed)
[place], [place], [address] Phone: [phone]   Fax:  [phone]  Physical Therapy Discharge Note  Patient Details  Name: Martha Wood MRN: [id number] Date of Birth: 11/30/1970 Referring Provider (PT): Karma Lew PA   Encounter Date: 09/08/2019  PT End of Session - 09/08/19 1105    Visit Number  3    Number of Visits  4    Date for PT Re-Evaluation  09/16/19    Authorization Type  Medicaid    PT Start Time  1102    PT Stop Time  1200    PT Time Calculation (min)  58 min    Activity Tolerance  Patient tolerated treatment well    Behavior During Therapy  [facility] for tasks assessed/performed       Past Medical History:  Diagnosis Date  . Cancer ([place])   . Colon cancer ([place])   . Diabetes mellitus without complication ([place])   . Lymphedema   . Obesity   . Pneumonia     Past Surgical History:  Procedure Laterality Date  . ABDOMINAL HYSTERECTOMY    . CESAREAN SECTION    . COLON SURGERY    . HERNIA REPAIR      There were no vitals filed for this visit.  Subjective Assessment - 09/08/19 1105    Subjective  Pt states that her legs got very swollen and she had to go to the MD to get Unna boots for a couple weeks to prevent increased edema. Pt reports that she has been tracking her steps and is able to walk 1500-2500 steps per days. She is up to walking 15 minutes a day using her walker much less. She states she has more energy and is feeling much better. She states she has been doing her lymphatic facilitation exercises every morning before she does anything else. She has also started a fluid pill which seems to be helping as well.    Pertinent History  History of colon cancer, hx cellulitis, DM II    Limitations  Standing;Walking    Diagnostic tests  [place] with no signs of DVT    Patient Stated Goals  I want to be able to still be able to use the techniques that I have learned in my daily routine  to be able to perform every day tasks.    Currently in Pain?  Yes    Pain Score  4     Pain Location  Leg    Pain Orientation  Right;Left    Pain Descriptors / Indicators  Burning    Pain Type  Chronic pain    Pain Onset  More than a month ago    Pain Frequency  Constant    Aggravating Factors   Neuropathy            LYMPHEDEMA/ONCOLOGY QUESTIONNAIRE - 09/08/19 1121      Right Lower Extremity Lymphedema   20 cm Proximal to Suprapatella  73.3 cm    At Midpatella/Popliteal Crease  44.9 cm    30 cm Proximal to Floor at Lateral Plantar Foot  54.7 cm    20 cm Proximal to Floor at Lateral Plantar Foot  40 1    10 cm Proximal to Floor at Lateral Malleoli  30.4 cm    5 cm Proximal to 1st MTP Joint  25.4 cm    Around Proximal Great Toe  8.5 cm      Left Lower Extremity Lymphedema  20 cm Proximal to Suprapatella  74.3 cm    At Midpatella/Popliteal Crease  46 cm    30 cm Proximal to Floor at Lateral Plantar Foot  54.9 cm    20 cm Proximal to Floor at Lateral Plantar Foot  45.1 cm    10 cm Proximal to Floor at Lateral Malleoli  31.1 cm    5 cm Proximal to 1st MTP Joint  26 cm    Around Proximal Great Toe  8.9 cm    Other  155   waist   Other  186   hips          Outpatient Rehab from 09/08/2019 in [place]  Lymphedema Life Impact Scale Total Score  55.88 %                   PT Education - 09/08/19 1201    Education Details  Pt will return for evaluation at the first of the year due to insurance. She will continue to perform MLD and exercises at home. Discussed in depth with patient the importance of continueing with these activities. Pt is very responsive and discussed her increase in ambulation different exercise techniques that she is trying to incorporate. Discussed the benefit of pools on LE lymphedema and overall fitness. Discussed pt improvements and how she will most likely lose more fluid in her thighs before her  lower legs due to congestion in these areas needs to clear before congestion in the rest of the leg can clear.    Person(s) Educated  Patient    Methods  Explanation    Comprehension  Verbalized understanding       PT Short Term Goals - 09/08/19 1139      PT SHORT TERM GOAL #1   Title  Pt will be independent with initial HEP within 3 weeks.    Baseline  Pt is performing her HEP every day.    Status  Achieved    Target Date  09/13/19        PT Long Term Goals - 09/08/19 1139      PT LONG TERM GOAL #1   Title  Pt will report 15% improvement in pain with ambulation within 3 weeks in order to decrease risk for immobility    Baseline  Pt reports 25-30% improvement.    Status  Achieved      PT LONG TERM GOAL #2   Title  Pt will demonstrate self MLD of the trunk with good form, skin stretch and direction within 3 weeks in order to promote autonomy of care.    Baseline  Pt is proficient in self MLD with great form.    Status  Achieved      PT LONG TERM GOAL #3   Title  Pt will sore 65% on the lymphedema life impace scale within 3 weeks .    Baseline  55.88% on LLIS 09/08/19    Status  Achieved            Plan - 09/08/19 1104    Clinical Impression Statement  Pt presents with significant improvement in overall demeanor and physical ability. She did not have her walker and was demonstrating an overall significant improvement in energy levels. Pt discussed that she has been improving and her family has noted that she is more energetic and is using her walker much less. She states that she is motivated to get better and is very happy to have exercises and things  to do at home that she does not need gym equipment to perform. Pt has had a significant decrease in circumferential measurements of Bil thighs without much change in the lower legs. She has met all of her initial goals that were set for function and mobility. Pt will be discharged at this time due to insurnace limitations and  will return at the beginning of the year for further treatment.    Personal Factors and Comorbidities  Age;Comorbidity 2    Comorbidities  obesity, DM II    Stability/Clinical Decision Making  Stable/Uncomplicated    PT Frequency  1x / week    PT Duration  3 weeks    PT Treatment/Interventions  ADLs/Self Care Home Management;Electrical Stimulation;Iontophoresis 37m/ml Dexamethasone;Moist Heat;Traction;Gait training;Stair training;Functional mobility training;Therapeutic activities;Therapeutic exercise;Balance training;Neuromuscular re-education;Patient/family education;Manual techniques;Manual lymph drainage;Compression bandaging;Vasopneumatic Device    PT Next Visit Plan  Assess self MLD of the trunk and thighs and exercises, continue with MLD of the trunk and possible LE    PT Home Exercise Plan  Access Code: [email or web address]   Consulted and Agree with Plan of Care  Patient       Patient will benefit from skilled therapeutic intervention in order to improve the following deficits and impairments:  Decreased balance, Pain, Increased edema  Visit Diagnosis: Lymphedema  Muscle weakness (generalized)     Problem List Patient Active Problem List   Diagnosis Date Noted  . Cellulitis of leg 05/03/2019  . Lactic acidosis 05/03/2019  . Hyperglycemia 05/03/2019  . Depression 05/03/2019  . History of colon cancer 05/03/2019  . Lymphedema 05/03/2019    PHYSICAL THERAPY DISCHARGE SUMMARY  Visits from Start of Care: 2 Plan: Patient agrees to discharge.  Patient goals were met. Patient is being discharged due to                                                     ?????  Pt is being discharged due to insurance limitations.     CAnder Purpura12/23/2020, 12:08 PM  [place] [address]Phone: [phone]  Fax:  [phone] Name: Martha MccleodMRN: [id number] of Birth: 103-01-1971

## 2019-09-20 ENCOUNTER — Other Ambulatory Visit: Payer: Self-pay

## 2019-09-20 ENCOUNTER — Ambulatory Visit: Payer: Medicaid Other | Attending: Family Medicine

## 2019-09-20 DIAGNOSIS — I890 Lymphedema, not elsewhere classified: Secondary | ICD-10-CM | POA: Diagnosis present

## 2019-09-20 DIAGNOSIS — M6281 Muscle weakness (generalized): Secondary | ICD-10-CM | POA: Insufficient documentation

## 2019-09-20 NOTE — Therapy (Signed)
[place], [place], [address] Phone: [phone]   Fax:  [phone]  Physical Therapy Re-Evaluation  Patient Details  Name: Martha Wood MRN: [id number] Date of Birth: [birth year] Referring Provider (PT): Karma Lew PA   Encounter Date: 09/20/2019  PT End of Session - 09/20/19 1306    Visit Number  4    Number of Visits  7    Date for PT Re-Evaluation  09/16/19    Authorization Type  Medicaid    PT Start Time  1304    PT Stop Time  1345    PT Time Calculation (min)  41 min    Activity Tolerance  Patient tolerated treatment well    Behavior During Therapy  [facility] for tasks assessed/performed       Past Medical History:  Diagnosis Date  . Cancer ([place])   . Colon cancer ([place])   . Diabetes mellitus without complication ([place])   . Lymphedema   . Obesity   . Pneumonia     Past Surgical History:  Procedure Laterality Date  . ABDOMINAL HYSTERECTOMY    . CESAREAN SECTION    . COLON SURGERY    . HERNIA REPAIR      There were no vitals filed for this visit.  Subjective Assessment - 09/20/19 1306    Subjective  Pt reports that she has gotten up to 2500 steps a day for 6 days. She states that she is feeling pretty good. She has not yet received her reduction kit.    Pertinent History  History of colon cancer, hx cellulitis, DM II    Limitations  Standing;Walking    Diagnostic tests  [place] with no signs of DVT    Patient Stated Goals  I want to be able to still be able to use the techniques that I have learned in my daily routine to be able to perform every day tasks.    Currently in Pain?  Yes    Pain Score  3     Pain Location  Leg    Pain Orientation  Right;Left    Pain Descriptors / Indicators  Burning    Pain Onset  More than a month ago    Pain Frequency  Constant    Aggravating Factors   Neuropathy    Pain Relieving Factors  decreased fluid and rest         [facility] PT Assessment -  09/20/19 0001      Strength   Strength Assessment Site  Hip;Knee;Ankle    Right/Left Hip  Right;Left    Right Hip Flexion  4/5    Right Hip ABduction  4/5    Right Hip ADduction  4/5    Left Hip Flexion  4/5    Left Hip ABduction  3+/5    Left Hip ADduction  3+/5    Right/Left Knee  Right;Left    Right Knee Flexion  5/5    Right Knee Extension  4+/5    Left Knee Flexion  5/5    Left Knee Extension  4+/5    Right/Left Ankle  Right;Left    Right Ankle Dorsiflexion  5/5    Left Ankle Dorsiflexion  5/5        LYMPHEDEMA/ONCOLOGY QUESTIONNAIRE - 09/20/19 1310      Right Lower Extremity Lymphedema   20 cm Proximal to Suprapatella  72 cm    At Midpatella/Popliteal Crease  44.9 cm    30 cm Proximal to  Floor at Lateral Plantar Foot  55.4 cm    20 cm Proximal to Floor at Lateral Plantar Foot  42._0 cm Proximal to Floor at Lateral Malleoli  29.7 cm    5 cm Proximal to 1st MTP Joint  23.9 cm    Around Proximal Great Toe  8 cm      Left Lower Extremity Lymphedema   20 cm Proximal to Suprapatella  73.5 cm    At Midpatella/Popliteal Crease  44.9 cm    30 cm Proximal to Floor at Lateral Plantar Foot  54 cm    20 cm Proximal to Floor at Lateral Plantar Foot  44.3 cm    10 cm Proximal to Floor at Lateral Malleoli  31.8 cm    5 cm Proximal to 1st MTP Joint  24.8 cm    Around Proximal Great Toe  8 cm           [facility]patient [facility]20/[facility]  Lymphedema Life Impact Scale Total Score  32.35 %                   PT Education - 09/20/19 1335    Education Details  Pt will continue with performing MLD and exercises at home. Pt was educated on her progress with physical therapy since her initial evaluation. Discussed next steps with physical therapy including discussing fit of reduction kit and compression shorts. Pt was assisted with contacting [employer] to check on abdominal compression garment. Extra time spent  discussing how to increase walking continuously in order to reach 30 min of continuous gait goal in order to decrease lymphedema due to research reporting that 30 min/6 days a week decreases lymphedema by up to 43%.    Person(s) Educated  Patient    Methods  Explanation    Comprehension  Verbalized understanding       PT Short Term Goals - 09/08/19 1139      PT SHORT TERM GOAL #1   Title  Pt will be independent with initial HEP within 3 weeks.    Baseline  Pt is performing her HEP every day.    Status  Achieved    Target Date  09/13/19        PT Long Term Goals - 09/20/19 1356      PT LONG TERM GOAL #1   Title  Pt will report 15% improvement in pain with ambulation within 3 weeks in order to decrease risk for immobility    Baseline  Pt reports 25-30% improvement.    Status  Achieved      PT LONG TERM GOAL #2   Title  Pt will demonstrate self MLD of the trunk with good form, skin stretch and direction within 3 weeks in order to promote autonomy of care.    Baseline  Pt is proficient in self MLD with great form.    Status  Achieved      PT LONG TERM GOAL #3   Title  Pt will sore 65% on the lymphedema life impace scale within 3 weeks .    Baseline  32.35%      PT LONG TERM GOAL #4   Title  Pt will receive appropriate compression garment for the legs and abdomen in order to self manage lymphedema.    Baseline  pt does not have compression garments.    Time  8    Period  Weeks    Status  New    Target Date  11/22/19      PT LONG TERM GOAL #5   Title  Pt will have 3 cm or greater girth reduction in the thigh and lower leg and 5 cm girth reduction at the waist and hip in order to demonstrate decreased fluid.    Baseline  see measurements.    Time  8    Period  Weeks    Status  New    Target Date  11/22/19            Plan - 09/20/19 1305    Clinical Impression Statement  Pt continues with reduction throughout the L LE with slight increase in R lower leg but a  decrease in R upper leg. Pt has increased her walking distance significantly since her initial evaluation. Pt strength has improved significantly since her initial evaluation. She is progressing with all of her goals. Pt currently has not yet recieved her trunk piece for vasopneumatic pump or recieved her reduction kit/compression shorts. Pt will benefit from continued physical therapy services 1x/week for 3 weeks in order to decrease risk for immobility, decrease edema and reduce risk for hospitilization related to infection.    Personal Factors and Comorbidities  Age;Comorbidity 2    Comorbidities  obesity, DM II    Rehab Potential  Good    PT Frequency  1x / week    PT Duration  3 weeks    PT Treatment/Interventions  ADLs/Self Care Home Management;Electrical Stimulation;Iontophoresis 26m/ml Dexamethasone;Moist Heat;Traction;Gait training;Stair training;Functional mobility training;Therapeutic activities;Therapeutic exercise;Balance training;Neuromuscular re-education;Patient/family education;Manual techniques;Manual lymph drainage;Compression bandaging;Vasopneumatic Device    PT Next Visit Plan  Fit reduction kit. Assess self MLD of the trunk and thighs and exercises, continue with MLD of the trunk and possible LE    PT Home Exercise Plan  Access Code: [email or web address]   Consulted and Agree with Plan of Care  Patient       Patient will benefit from skilled therapeutic intervention in order to improve the following deficits and impairments:  Decreased balance, Pain, Increased edema  Visit Diagnosis: Lymphedema  Muscle weakness (generalized)     Problem List Patient Active Problem List   Diagnosis Date Noted  . Cellulitis of leg 05/03/2019  . Lactic acidosis 05/03/2019  . Hyperglycemia 05/03/2019  . Depression 05/03/2019  . History of colon cancer 05/03/2019  . Lymphedema 05/03/2019    CAnder Purpura PT 09/20/2019, 1:59 PM  [place] [address]Phone: [phone]  Fax:  [phone] Name: JShanikwa StateMRN: [id number] of Birth: [birth year]

## 2019-09-29 ENCOUNTER — Ambulatory Visit: Payer: Medicaid Other

## 2019-09-29 ENCOUNTER — Other Ambulatory Visit: Payer: Self-pay

## 2019-09-29 DIAGNOSIS — M6281 Muscle weakness (generalized): Secondary | ICD-10-CM

## 2019-09-29 DIAGNOSIS — I890 Lymphedema, not elsewhere classified: Secondary | ICD-10-CM | POA: Diagnosis not present

## 2019-09-29 NOTE — Therapy (Signed)
[place], [place], [address] Phone: [phone]   Fax:  [phone]  Physical Therapy Treatment  Patient Details  Name: Martha Wood MRN: [id number] Date of Birth: 03-23-1971 Referring Provider (PT): Karma Lew PA   Encounter Date: 09/29/2019  PT End of Session - 09/29/19 1358    Visit Number  5    Number of Visits  7    Date for PT Re-Evaluation  11/22/19    Authorization Type  Medicaid    PT Start Time  1400    PT Stop Time  1500    PT Time Calculation (min)  60 min    Activity Tolerance  Patient tolerated treatment well    Behavior During Therapy  [facility] for tasks assessed/performed       Past Medical History:  Diagnosis Date  . Cancer ([place])   . Colon cancer ([place])   . Diabetes mellitus without complication ([place])   . Lymphedema   . Obesity   . Pneumonia     Past Surgical History:  Procedure Laterality Date  . ABDOMINAL HYSTERECTOMY    . CESAREAN SECTION    . COLON SURGERY    . HERNIA REPAIR      There were no vitals filed for this visit.  Subjective Assessment - 09/29/19 1406    Subjective  Pt states that she has had a rough couple of days due to the cold weather and her neuropathy and rheumatoid arthritis. She has not yet recieved her reduction kit.    Pertinent History  History of colon cancer, hx cellulitis, DM II    Limitations  Standing;Walking    Diagnostic tests  [place] with no signs of DVT    Patient Stated Goals  I want to be able to still be able to use the techniques that I have learned in my daily routine to be able to perform every day tasks.    Currently in Pain?  Yes    Pain Score  5     Pain Location  Leg    Pain Orientation  Right;Left    Pain Descriptors / Indicators  Burning    Pain Type  Chronic pain    Pain Onset  More than a month ago    Pain Frequency  Constant    Aggravating Factors   Neuropathy    Pain Relieving Factors  decreasd fluid and rest.    Effect of Pain on Daily Activities  pt has difficulty walking due to heaviness and pain in her legs.            LYMPHEDEMA/ONCOLOGY QUESTIONNAIRE - 09/29/19 1457      Left Lower Extremity Lymphedema   Other  157   waist   Other  175   hips          Outpatient Re[facility] from [facility]20/[facility]  Lymphedema Life Impact Scale Total Score  32.35 %           [facility] Adult PT Treatment/Exercise - 09/29/19 0001      Manual Therapy   Manual Therapy  Manual Lymphatic Drainage (MLD);Edema management    Edema Management  Pt circumferential measurements were taken this session    Manual Lymphatic Drainage (MLD)  In supine with legs elevated over level of the heart: short neck, swimming in the terminus, BIl axillary and inguinal nodes, Bil axillo-inguinal anastomosis, medial to lateral thighs, lateral thigh, medial to lateral thigh, lateral thigh, popliteal nodes, bottle  neck of the knee, all surfaces of the lower leg, medial/lateral ankle, dorsum of the foot.              PT Education - 09/29/19 1500    Education Details  Pt will continue performing self MLD and exercises at home.    Person(s) Educated  Patient    Methods  Explanation    Comprehension  Verbalized understanding       PT Short Term Goals - 09/08/19 1139      PT SHORT TERM GOAL #1   Title  Pt will be independent with initial HEP within 3 weeks.    Baseline  Pt is performing her HEP every day.    Status  Achieved    Target Date  09/13/19        PT Long Term Goals - 09/20/19 1356      PT LONG TERM GOAL #1   Title  Pt will report 15% improvement in pain with ambulation within 3 weeks in order to decrease risk for immobility    Baseline  Pt reports 25-30% improvement.    Status  Achieved      PT LONG TERM GOAL #2   Title  Pt will demonstrate self MLD of the trunk with good form, skin stretch and direction within 3 weeks in order to promote autonomy of care.     Baseline  Pt is proficient in self MLD with great form.    Status  Achieved      PT LONG TERM GOAL #3   Title  Pt will sore 65% on the lymphedema life impace scale within 3 weeks .    Baseline  32.35%      PT LONG TERM GOAL #4   Title  Pt will receive appropriate compression garment for the legs and abdomen in order to self manage lymphedema.    Baseline  pt does not have compression garments.    Time  8    Period  Weeks    Status  New    Target Date  11/22/19      PT LONG TERM GOAL #5   Title  Pt will have 3 cm or greater girth reduction in the thigh and lower leg and 5 cm girth reduction at the waist and hip in order to demonstrate decreased fluid.    Baseline  see measurements.    Time  8    Period  Weeks    Status  New    Target Date  11/22/19            Plan - 09/29/19 1358    Clinical Impression Statement  Pt continues with reduction throughout the Bil LE this session with slight increase at the feet since her last session. MLD was performed this session for the anterior trunk and BLE with extra time spent at BLE with legs elevated. Creping at the Bil ankles and feet following MLD demonstrating improvement in fluid. Pt will benefit from continued POC at this time.    Personal Factors and Comorbidities  Age    Comorbidities  obesity, DM II    Rehab Potential  Good    PT Frequency  1x / week    PT Duration  3 weeks    PT Treatment/Interventions  ADLs/Self Care Home Management;Electrical Stimulation;Iontophoresis '4mg'$ /ml Dexamethasone;Moist Heat;Traction;Gait training;Stair training;Functional mobility training;Therapeutic activities;Therapeutic exercise;Balance training;Neuromuscular re-education;Patient/family education;Manual techniques;Manual lymph drainage;Compression bandaging;Vasopneumatic Device    PT Next Visit Plan  Fit reduction kit. Assess self MLD of  the trunk and thighs and exercises, continue with MLD of the trunk and possible LE    PT Home Exercise Plan   Access Code: [email or web address]    Consulted and Agree with Plan of Care  Patient       Patient will benefit from skilled therapeutic intervention in order to improve the following deficits and impairments:  Decreased balance, Pain, Increased edema  Visit Diagnosis: Muscle weakness (generalized)  Lymphedema     Problem List Patient Active Problem List   Diagnosis Date Noted  . Cellulitis of leg 05/03/2019  . Lactic acidosis 05/03/2019  . Hyperglycemia 05/03/2019  . Depression 05/03/2019  . History of colon cancer 05/03/2019  . Lymphedema 05/03/2019    Ander Purpura, PT 09/29/2019, 3:54 PM  [place], [place], [address] Phone: [phone]   Fax:  [phone]  Name: Martha Wood MRN: [id number] Date of Birth: [birth year]

## 2019-10-06 ENCOUNTER — Other Ambulatory Visit: Payer: Self-pay

## 2019-10-06 ENCOUNTER — Ambulatory Visit: Payer: Medicaid Other

## 2019-10-06 DIAGNOSIS — I890 Lymphedema, not elsewhere classified: Secondary | ICD-10-CM

## 2019-10-06 DIAGNOSIS — M6281 Muscle weakness (generalized): Secondary | ICD-10-CM

## 2019-10-06 NOTE — Therapy (Signed)
[place], [place], [address] Phone: [phone]   Fax:  [phone]  Physical Therapy Treatment  Patient Details  Name: Martha Wood MRN: [id number] Date of Birth: 07/25/1971 Referring Provider (PT): Karma Lew PA   Encounter Date: 10/06/2019  PT End of Session - 10/06/19 1416    Visit Number  6    Number of Visits  7    Date for PT Re-Evaluation  11/22/19    Authorization Type  Medicaid    PT Start Time  1601    PT Stop Time  1500    PT Time Calculation (min)  55 min    Activity Tolerance  Patient tolerated treatment well    Behavior During Therapy  [facility] for tasks assessed/performed       Past Medical History:  Diagnosis Date  . Cancer ([place])   . Colon cancer ([place])   . Diabetes mellitus without complication ([place])   . Lymphedema   . Obesity   . Pneumonia     Past Surgical History:  Procedure Laterality Date  . ABDOMINAL HYSTERECTOMY    . CESAREAN SECTION    . COLON SURGERY    . HERNIA REPAIR      There were no vitals filed for this visit.  Subjective Assessment - 10/06/19 1414    Subjective  Pt reports that she got 2857 steps yesterday which is the furthest that she has walked since she has started physical therapy. She has been painful and stiff becuase of the cold weather .    Pertinent History  History of colon cancer, hx cellulitis, DM II    Limitations  Standing;Walking    Diagnostic tests  [place] with no signs of DVT    Patient Stated Goals  I want to be able to still be able to use the techniques that I have learned in my daily routine to be able to perform every day tasks.    Currently in Pain?  Yes    Pain Score  4     Pain Location  Leg    Pain Orientation  Right;Left    Pain Descriptors / Indicators  Burning    Pain Type  Chronic pain    Pain Onset  More than a month ago    Pain Frequency  Constant    Aggravating Factors   neuropathy    Pain Relieving Factors   decreased fluid and rest    Effect of Pain on Daily Activities  pt has difficulty walking due to heaviness and pain in he legs.                  [facility]  Lymphedema Life Impact Scale Total Score  32.35 %           [facility] Adult PT Treatment/Exercise - 10/06/19 0001      Manual Therapy   Manual Therapy  Manual Lymphatic Drainage (MLD)    Manual Lymphatic Drainage (MLD)  In supine with legs elevated over level of the heart: short neck, swimming in the terminus, BIl axillary and inguinal nodes, Bil inguino-axillary anastomosis, medial to lateral thighs, lateral thigh, medial to lateral thigh, lateral thigh, posterior thigh, popliteal nodes, bottle neck of the knee, all surfaces of the lower leg, medial/lateral ankle, dorsum of the foot. Re-worked all surfaces             PT Education - 10/06/19 1502    Education  Details  Pt will continue performing self MLD and exercises at home including increasing steps.    Person(s) Educated  Patient    Methods  Explanation    Comprehension  Verbalized understanding       PT Short Term Goals - 09/08/19 1139      PT SHORT TERM GOAL #1   Title  Pt will be independent with initial HEP within 3 weeks.    Baseline  Pt is performing her HEP every day.    Status  Achieved    Target Date  09/13/19        PT Long Term Goals - 09/20/19 1356      PT LONG TERM GOAL #1   Title  Pt will report 15% improvement in pain with ambulation within 3 weeks in order to decrease risk for immobility    Baseline  Pt reports 25-30% improvement.    Status  Achieved      PT LONG TERM GOAL #2   Title  Pt will demonstrate self MLD of the trunk with good form, skin stretch and direction within 3 weeks in order to promote autonomy of care.    Baseline  Pt is proficient in self MLD with great form.    Status  Achieved      PT LONG TERM GOAL #3   Title  Pt will sore 65% on the  lymphedema life impace scale within 3 weeks .    Baseline  32.35%      PT LONG TERM GOAL #4   Title  Pt will receive appropriate compression garment for the legs and abdomen in order to self manage lymphedema.    Baseline  pt does not have compression garments.    Time  8    Period  Weeks    Status  New    Target Date  11/22/19      PT LONG TERM GOAL #5   Title  Pt will have 3 cm or greater girth reduction in the thigh and lower leg and 5 cm girth reduction at the waist and hip in order to demonstrate decreased fluid.    Baseline  see measurements.    Time  8    Period  Weeks    Status  New    Target Date  11/22/19            Plan - 10/06/19 1416    Clinical Impression Statement  Pt continues with improvement in the BLE with improved coloration of the Bil lower legs and less skin tenderness. She has 3 small bandages areas that she said were weeping very slightly; overall her weeping has improved significantly. MLD was performed for the anterior trunk and BLE in elevation with extra time continued at the posterior legs this session and at the Bil lower legs, feet and ankles. Creping at the Bil dorsum of the feet and increased skin stretch at Bil lateral lower legs following MLD. Pt will benefit rom continued POC at this time.    Personal Factors and Comorbidities  Age    Comorbidities  obesity, DM II    Stability/Clinical Decision Making  Stable/Uncomplicated    Rehab Potential  Good    PT Frequency  1x / week    PT Duration  3 weeks    PT Treatment/Interventions  ADLs/Self Care Home Management;Electrical Stimulation;Iontophoresis 53m/ml Dexamethasone;Moist Heat;Traction;Gait training;Stair training;Functional mobility training;Therapeutic activities;Therapeutic exercise;Balance training;Neuromuscular re-education;Patient/family education;Manual techniques;Manual lymph drainage;Compression bandaging;Vasopneumatic Device    PT Next Visit Plan  Fit reduction kit. Assess self MLD of  the trunk and thighs and exercises, continue with MLD of the trunk and possible LE    PT Home Exercise Plan  Access Code: [email or web address]    Consulted and Agree with Plan of Care  Patient       Patient will benefit from skilled therapeutic intervention in order to improve the following deficits and impairments:  Decreased balance, Pain, Increased edema  Visit Diagnosis: Muscle weakness (generalized)  Lymphedema     Problem List Patient Active Problem List   Diagnosis Date Noted  . Cellulitis of leg 05/03/2019  . Lactic acidosis 05/03/2019  . Hyperglycemia 05/03/2019  . Depression 05/03/2019  . History of colon cancer 05/03/2019  . Lymphedema 05/03/2019    Ander Purpura, PT 10/06/2019, 3:06 PM  [place], [place], [address] Phone: [phone]   Fax:  [phone]  Name: Martha Wood MRN: [id number] Date of Birth: 10/25/1970

## 2019-10-13 ENCOUNTER — Other Ambulatory Visit: Payer: Self-pay

## 2019-10-13 ENCOUNTER — Ambulatory Visit: Payer: Medicaid Other

## 2019-10-13 DIAGNOSIS — M6281 Muscle weakness (generalized): Secondary | ICD-10-CM

## 2019-10-13 DIAGNOSIS — I890 Lymphedema, not elsewhere classified: Secondary | ICD-10-CM | POA: Diagnosis not present

## 2019-10-13 NOTE — Therapy (Signed)
[place], [place], [address] Phone: [phone]   Fax:  [phone]  Physical Therapy Re-Evaluation  Patient Details  Name: Martha Wood MRN: [id number] Date of Birth: 1971-03-19 Referring Provider (PT): Karma Lew PA   Encounter Date: 10/13/2019  PT End of Session - 10/13/19 1308    Visit Number  7    Number of Visits  7    Date for PT Re-Evaluation  12/01/19    Authorization Type  Medicaid    PT Start Time  2725    PT Stop Time  1400    PT Time Calculation (min)  57 min    Activity Tolerance  Patient tolerated treatment well    Behavior During Therapy  [facility] for tasks assessed/performed       Past Medical History:  Diagnosis Date  . Cancer ([place])   . Colon cancer ([place])   . Diabetes mellitus without complication ([place])   . Lymphedema   . Obesity   . Pneumonia     Past Surgical History:  Procedure Laterality Date  . ABDOMINAL HYSTERECTOMY    . CESAREAN SECTION    . COLON SURGERY    . HERNIA REPAIR      There were no vitals filed for this visit.  Subjective Assessment - 10/13/19 1309    Subjective  Pt reports that she has been doing well. She states that she has recieved the trunk piece for the pump and has been using it along with the leg pumps. Pt states that she has done less steps due to the weather but was able to easily reach 1600 steps both days.    Pertinent History  History of colon cancer, hx cellulitis, DM II    Limitations  Standing;Walking    Diagnostic tests  [place] with no signs of DVT    Patient Stated Goals  I want to be able to still be able to use the techniques that I have learned in my daily routine to be able to perform every day tasks.    Currently in Pain?  No/denies    Pain Score  0-No pain            LYMPHEDEMA/ONCOLOGY QUESTIONNAIRE - 10/13/19 1354      Right Lower Extremity Lymphedema   20 cm Proximal to Suprapatella  71.2 cm    10 cm Proximal to  Suprapatella  68.4 cm    At Midpatella/Popliteal Crease  43.5 cm    30 cm Proximal to Floor at Lateral Plantar Foot  50.9 cm    20 cm Proximal to Floor at Lateral Plantar Foot  39.'7 1    10 '$ cm Proximal to Floor at Lateral Malleoli  28.4 cm    5 cm Proximal to 1st MTP Joint  24.2 cm    Around Proximal Great Toe  8 cm      Left Lower Extremity Lymphedema   20 cm Proximal to Suprapatella  74 cm    10 cm Proximal to Suprapatella  66 cm    At Midpatella/Popliteal Crease  44 cm    30 cm Proximal to Floor at Lateral Plantar Foot  50.6 cm    20 cm Proximal to Floor at Lateral Plantar Foot  40.5 cm    10 cm Proximal to Floor at Lateral Malleoli  28.7 cm    5 cm Proximal to 1st MTP Joint  24.9 cm    Around Proximal Great Toe  8.4 cm  Other  145.7    Other  164           Outpatient Rehab from 10/13/2019 in [place]  Lymphedema Life Impact Scale Total Score  32.35 %           [facility] Adult PT Treatment/Exercise - 10/13/19 0001      Manual Therapy   Manual Therapy  Manual Lymphatic Drainage (MLD)    Manual Lymphatic Drainage (MLD)  In supine with legs elevated over level of the heart: short neck, swimming in the terminus, BIl axillary and inguinal nodes, Bil inguino-axillary anastomosis, medial to lateral thighs, lateral thigh, medial to lateral thigh, lateral thigh, posterior thigh, popliteal nodes, bottle neck of the knee, all surfaces of the lower leg, medial/lateral ankle, dorsum of the foot. Re-worked all surfaces             PT Education - 10/13/19 1415    Education Details  Pt will continue performing pump at home with BLE and trunk piece. She will continue with exercises daily.    Person(s) Educated  Patient    Methods  Explanation    Comprehension  Verbalized understanding       PT Short Term Goals - 09/08/19 1139      PT SHORT TERM GOAL #1   Title  Pt will be independent with initial HEP within 3 weeks.    Baseline  Pt is  performing her HEP every day.    Status  Achieved    Target Date  09/13/19        PT Long Term Goals - 10/13/19 1312      PT LONG TERM GOAL #1   Title  Pt will report 75% improvement in pain with ambulation within 3 weeks in order to decrease risk for immobility    Baseline  pt reports 50-60% improvement since her initial evaluation.    Time  6    Period  Weeks    Status  On-going    Target Date  12/01/19      PT LONG TERM GOAL #2   Title  Pt will demonstrate self MLD of the trunk with good form, skin stretch and direction within 3 weeks in order to promote autonomy of care.    Baseline  Pt using her pump with leg and abdominal pieces every day.    Status  Achieved      PT LONG TERM GOAL #3   Title  Pt will sore 15% on the lymphedema life impact scale within 6 weeks .    Baseline  32.35% 10/13/19    Time  6    Period  Weeks    Status  Revised    Target Date  12/01/19      PT LONG TERM GOAL #4   Title  Pt will receive appropriate compression garment for the legs and abdomen in order to self manage lymphedema.    Baseline  pt does not have compression garments.    Time  6    Period  Weeks    Status  On-going    Target Date  12/01/19      PT LONG TERM GOAL #5   Title  Pt will have 3 cm or greater girth reduction in the thigh and lower leg and 5 cm girth reduction at the waist and hip in order to demonstrate decreased fluid.    Baseline  see measurements.    Time  6    Period  Weeks  Status  On-going    Target Date  12/01/19            Plan - 10/13/19 1308    Clinical Impression Statement  Pt continues with improvement in coloration of the BIl lower extremities. She no longer has any weeping in either lower leg. Pt trunk measurements have decreased significantly since her last measurements. She has maintained at her BLE with circumferential measurements but has not increased despite not yet receiving compression garments. Pt is reporting 50-60% improvement from her  initial evaluation. Pt will benefit from physical therapy 2x/week for 6 weeks in order to address the above limitations in order to decrease fluid in the BLE and continue to increase energy in order to decrease risk for hospitaliation related to immobility and infection.    Personal Factors and Comorbidities  Age    Comorbidities  obesity, DM II    Rehab Potential  Good    PT Frequency  2x / week    PT Duration  6 weeks    PT Treatment/Interventions  ADLs/Self Care Home Management;Electrical Stimulation;Iontophoresis '4mg'$ /ml Dexamethasone;Moist Heat;Traction;Gait training;Stair training;Functional mobility training;Therapeutic activities;Therapeutic exercise;Balance training;Neuromuscular re-education;Patient/family education;Manual techniques;Manual lymph drainage;Compression bandaging;Vasopneumatic Device    PT Next Visit Plan  Fit reduction kit. Assess self MLD of the trunk and thighs and exercises, continue with MLD of the trunk and possible LE    PT Home Exercise Plan  Access Code: [email or web address]    Consulted and Agree with Plan of Care  Patient       Patient will benefit from skilled therapeutic intervention in order to improve the following deficits and impairments:  Decreased balance, Pain, Increased edema  Visit Diagnosis: Muscle weakness (generalized)  Lymphedema     Problem List Patient Active Problem List   Diagnosis Date Noted  . Cellulitis of leg 05/03/2019  . Lactic acidosis 05/03/2019  . Hyperglycemia 05/03/2019  . Depression 05/03/2019  . History of colon cancer 05/03/2019  . Lymphedema 05/03/2019    Ander Purpura, PT 10/13/2019, 3:32 PM  [place], [place], [address] Phone: [phone]   Fax:  [phone]  Name: Martha Wood MRN: [id number] Date of Birth: 04-03-1971

## 2020-07-17 DEATH — deceased
